# Patient Record
Sex: Female | Born: 1980 | Race: White | Hispanic: No | Marital: Married | Smoking: Former smoker
Health system: Southern US, Community
[De-identification: ages and names within clinical notes are randomized; demographics above are authoritative.]

## PROBLEM LIST (undated history)

## (undated) DIAGNOSIS — R569 Unspecified convulsions: Secondary | ICD-10-CM

## (undated) DIAGNOSIS — G40909 Epilepsy, unspecified, not intractable, without status epilepticus: Secondary | ICD-10-CM

## (undated) DIAGNOSIS — D649 Anemia, unspecified: Secondary | ICD-10-CM

## (undated) DIAGNOSIS — Z1371 Encounter for nonprocreative screening for genetic disease carrier status: Secondary | ICD-10-CM

## (undated) DIAGNOSIS — K219 Gastro-esophageal reflux disease without esophagitis: Secondary | ICD-10-CM

## (undated) DIAGNOSIS — T884XXA Failed or difficult intubation, initial encounter: Secondary | ICD-10-CM

## (undated) DIAGNOSIS — J45909 Unspecified asthma, uncomplicated: Secondary | ICD-10-CM

## (undated) DIAGNOSIS — J189 Pneumonia, unspecified organism: Secondary | ICD-10-CM

## (undated) DIAGNOSIS — Z8041 Family history of malignant neoplasm of ovary: Secondary | ICD-10-CM

## (undated) DIAGNOSIS — E039 Hypothyroidism, unspecified: Secondary | ICD-10-CM

## (undated) DIAGNOSIS — R0600 Dyspnea, unspecified: Secondary | ICD-10-CM

## (undated) DIAGNOSIS — R011 Cardiac murmur, unspecified: Secondary | ICD-10-CM

## (undated) HISTORY — PX: TONSILLECTOMY: SUR1361

## (undated) HISTORY — DX: Encounter for nonprocreative screening for genetic disease carrier status: Z13.71

## (undated) HISTORY — PX: ABDOMINAL HYSTERECTOMY: SHX81

## (undated) HISTORY — DX: Family history of malignant neoplasm of ovary: Z80.41

## (undated) HISTORY — PX: TUBAL LIGATION: SHX77

## (undated) HISTORY — DX: Gastro-esophageal reflux disease without esophagitis: K21.9

---
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
## 2001-04-14 ENCOUNTER — Other Ambulatory Visit: Admission: RE | Admit: 2001-04-14 | Discharge: 2001-04-14 | Payer: Self-pay | Admitting: Obstetrics & Gynecology

## 2001-08-15 ENCOUNTER — Ambulatory Visit (HOSPITAL_COMMUNITY): Admission: RE | Admit: 2001-08-15 | Discharge: 2001-08-15 | Payer: Self-pay | Admitting: Obstetrics and Gynecology

## 2001-08-15 ENCOUNTER — Encounter: Payer: Self-pay | Admitting: Obstetrics and Gynecology

## 2001-09-04 ENCOUNTER — Observation Stay (HOSPITAL_COMMUNITY): Admission: AD | Admit: 2001-09-04 | Discharge: 2001-09-05 | Payer: Self-pay | Admitting: Obstetrics and Gynecology

## 2001-10-12 ENCOUNTER — Encounter: Payer: Self-pay | Admitting: Obstetrics and Gynecology

## 2001-10-12 ENCOUNTER — Ambulatory Visit (HOSPITAL_COMMUNITY): Admission: RE | Admit: 2001-10-12 | Discharge: 2001-10-12 | Payer: Self-pay | Admitting: Obstetrics and Gynecology

## 2001-11-14 ENCOUNTER — Inpatient Hospital Stay (HOSPITAL_COMMUNITY): Admission: AD | Admit: 2001-11-14 | Discharge: 2001-11-17 | Payer: Self-pay | Admitting: *Deleted

## 2001-11-18 ENCOUNTER — Encounter: Admission: RE | Admit: 2001-11-18 | Discharge: 2001-12-18 | Payer: Self-pay | Admitting: Obstetrics and Gynecology

## 2001-12-19 ENCOUNTER — Encounter: Admission: RE | Admit: 2001-12-19 | Discharge: 2002-01-18 | Payer: Self-pay | Admitting: Obstetrics and Gynecology

## 2002-02-18 ENCOUNTER — Encounter: Admission: RE | Admit: 2002-02-18 | Discharge: 2002-03-20 | Payer: Self-pay | Admitting: Obstetrics and Gynecology

## 2002-04-20 ENCOUNTER — Encounter: Admission: RE | Admit: 2002-04-20 | Discharge: 2002-05-20 | Payer: Self-pay | Admitting: Obstetrics and Gynecology

## 2002-05-21 ENCOUNTER — Encounter: Admission: RE | Admit: 2002-05-21 | Discharge: 2002-06-20 | Payer: Self-pay | Admitting: Obstetrics and Gynecology

## 2002-06-21 ENCOUNTER — Encounter: Admission: RE | Admit: 2002-06-21 | Discharge: 2002-07-21 | Payer: Self-pay | Admitting: Obstetrics and Gynecology

## 2002-08-21 ENCOUNTER — Encounter: Admission: RE | Admit: 2002-08-21 | Discharge: 2002-09-20 | Payer: Self-pay | Admitting: Obstetrics and Gynecology

## 2006-06-21 ENCOUNTER — Ambulatory Visit: Payer: Self-pay | Admitting: *Deleted

## 2006-06-24 ENCOUNTER — Ambulatory Visit: Payer: Self-pay | Admitting: *Deleted

## 2008-09-27 ENCOUNTER — Emergency Department: Payer: Self-pay | Admitting: Emergency Medicine

## 2008-11-01 ENCOUNTER — Ambulatory Visit: Payer: Self-pay | Admitting: Family Medicine

## 2009-01-14 ENCOUNTER — Inpatient Hospital Stay: Payer: Self-pay | Admitting: Obstetrics and Gynecology

## 2009-03-25 ENCOUNTER — Observation Stay: Payer: Self-pay

## 2009-03-26 ENCOUNTER — Ambulatory Visit: Payer: Self-pay

## 2009-05-05 ENCOUNTER — Ambulatory Visit: Payer: Self-pay

## 2009-05-06 ENCOUNTER — Inpatient Hospital Stay: Payer: Self-pay

## 2011-12-24 ENCOUNTER — Emergency Department: Payer: Self-pay | Admitting: Emergency Medicine

## 2011-12-24 LAB — BASIC METABOLIC PANEL
Anion Gap: 6 — ABNORMAL LOW (ref 7–16)
Calcium, Total: 8.8 mg/dL (ref 8.5–10.1)
Co2: 27 mmol/L (ref 21–32)
EGFR (African American): >60
Glucose: 76 mg/dL (ref 65–99)
Osmolality: 279 (ref 275–301)

## 2011-12-24 LAB — HEPATIC FUNCTION PANEL A (ARMC)
Albumin: 3.3 g/dL — ABNORMAL LOW (ref 3.4–5.0)
Bilirubin, Direct: 0.1 mg/dL (ref 0.00–0.20)
Bilirubin,Total: 0.4 mg/dL (ref 0.2–1.0)
SGOT(AST): 24 U/L (ref 15–37)
Total Protein: 7.7 g/dL (ref 6.4–8.2)

## 2011-12-24 LAB — CBC
HGB: 11.5 g/dL — ABNORMAL LOW (ref 12.0–16.0)
MCH: 25.2 pg — ABNORMAL LOW (ref 26.0–34.0)
MCHC: 32.5 g/dL (ref 32.0–36.0)
Platelet: 321 10*3/uL (ref 150–440)
RBC: 4.55 10*6/uL (ref 3.80–5.20)
RDW: 15.8 % — ABNORMAL HIGH (ref 11.5–14.5)

## 2011-12-24 LAB — DIFFERENTIAL
Basophil %: 0.9 %
Eosinophil #: 0.2 10*3/uL (ref 0.0–0.7)
Eosinophil %: 2.2 %
Lymphocyte %: 26.1 %
Monocyte %: 6 %
Neutrophil #: 4.3 10*3/uL (ref 1.4–6.5)
Neutrophil %: 64.8 %

## 2011-12-24 LAB — TROPONIN I: Troponin-I: <0.02 ng/mL

## 2013-07-09 ENCOUNTER — Emergency Department (HOSPITAL_COMMUNITY): Payer: Self-pay

## 2013-07-09 ENCOUNTER — Emergency Department (HOSPITAL_COMMUNITY)
Admission: EM | Admit: 2013-07-09 | Discharge: 2013-07-09 | Disposition: A | Discharge status: Home / Self Care | Payer: Self-pay | Attending: Emergency Medicine | Admitting: Emergency Medicine

## 2013-07-09 ENCOUNTER — Encounter (HOSPITAL_COMMUNITY): Payer: Self-pay | Admitting: Emergency Medicine

## 2013-07-09 DIAGNOSIS — R638 Other symptoms and signs concerning food and fluid intake: Secondary | ICD-10-CM | POA: Insufficient documentation

## 2013-07-09 DIAGNOSIS — G479 Sleep disorder, unspecified: Secondary | ICD-10-CM | POA: Insufficient documentation

## 2013-07-09 DIAGNOSIS — R059 Cough, unspecified: Secondary | ICD-10-CM | POA: Insufficient documentation

## 2013-07-09 DIAGNOSIS — R071 Chest pain on breathing: Secondary | ICD-10-CM | POA: Insufficient documentation

## 2013-07-09 DIAGNOSIS — Z8669 Personal history of other diseases of the nervous system and sense organs: Secondary | ICD-10-CM | POA: Insufficient documentation

## 2013-07-09 DIAGNOSIS — R0789 Other chest pain: Secondary | ICD-10-CM

## 2013-07-09 DIAGNOSIS — R05 Cough: Secondary | ICD-10-CM | POA: Insufficient documentation

## 2013-07-09 DIAGNOSIS — R0602 Shortness of breath: Secondary | ICD-10-CM | POA: Insufficient documentation

## 2013-07-09 HISTORY — DX: Unspecified convulsions: R56.9

## 2013-07-09 HISTORY — DX: Epilepsy, unspecified, not intractable, without status epilepticus: G40.909

## 2013-07-09 LAB — BASIC METABOLIC PANEL
BUN: 8 mg/dL (ref 6–23)
CO2: 25 mEq/L (ref 19–32)
Calcium: 9.3 mg/dL (ref 8.4–10.5)
Chloride: 103 mEq/L (ref 96–112)
Creatinine, Ser: 0.83 mg/dL (ref 0.50–1.10)
GFR calc Af Amer: >90 mL/min (ref >90)
GFR calc non Af Amer: >90 mL/min (ref >90)
Glucose, Bld: 95 mg/dL (ref 70–99)
Potassium: 3.6 mEq/L — ABNORMAL LOW (ref 3.7–5.3)
Sodium: 141 mEq/L (ref 137–147)

## 2013-07-09 LAB — CBC WITH DIFFERENTIAL/PLATELET
Basophils Absolute: 0 10*3/uL (ref 0.0–0.1)
Basophils Relative: 0 % (ref 0–1)
Eosinophils Absolute: 0.1 10*3/uL (ref 0.0–0.7)
Eosinophils Relative: 1 % (ref 0–5)
HCT: 38.2 % (ref 36.0–46.0)
Hemoglobin: 12.2 g/dL (ref 12.0–15.0)
Lymphocytes Relative: 20 % (ref 12–46)
Lymphs Abs: 1.6 10*3/uL (ref 0.7–4.0)
MCH: 26.1 pg (ref 26.0–34.0)
MCHC: 31.9 g/dL (ref 30.0–36.0)
MCV: 81.6 fL (ref 78.0–100.0)
Monocytes Absolute: 0.5 10*3/uL (ref 0.1–1.0)
Monocytes Relative: 6 % (ref 3–12)
Neutro Abs: 6 10*3/uL (ref 1.7–7.7)
Neutrophils Relative %: 73 % (ref 43–77)
Platelets: 380 10*3/uL (ref 150–400)
RBC: 4.68 MIL/uL (ref 3.87–5.11)
RDW: 14.4 % (ref 11.5–15.5)
WBC: 8.3 10*3/uL (ref 4.0–10.5)

## 2013-07-09 LAB — TROPONIN I: Troponin I: <0.3 ng/mL (ref <0.30)

## 2013-07-09 LAB — D-DIMER, QUANTITATIVE: D-Dimer, Quant: 0.52 ug/mL-FEU — ABNORMAL HIGH (ref 0.00–0.48)

## 2013-07-09 MED ORDER — FAMOTIDINE 40 MG PO TABS: 40.0000 mg | ORAL_TABLET | Freq: Two times a day (BID) | ORAL | Status: DC

## 2013-07-09 MED ORDER — ACETAMINOPHEN 500 MG PO TABS
1000.0000 mg | ORAL_TABLET | Freq: Once | ORAL | Status: AC
  Administered 2013-07-09: 1000 mg via ORAL
  Filled 2013-07-09: qty 2

## 2013-07-09 MED ORDER — SODIUM CHLORIDE 0.9 % IV SOLN
INTRAVENOUS | Status: DC
  Administered 2013-07-09: 20:00:00 via INTRAVENOUS

## 2013-07-09 MED ORDER — CYCLOBENZAPRINE HCL 10 MG PO TABS
10.0000 mg | ORAL_TABLET | Freq: Once | ORAL | Status: AC
  Administered 2013-07-09: 10 mg via ORAL
  Filled 2013-07-09: qty 1

## 2013-07-09 MED ORDER — NAPROXEN 375 MG PO TABS: 375.0000 mg | ORAL_TABLET | Freq: Two times a day (BID) | ORAL | Status: DC

## 2013-07-09 MED ORDER — METHOCARBAMOL 500 MG PO TABS: ORAL_TABLET | ORAL | Status: DC

## 2013-07-09 MED ORDER — TRAMADOL HCL 50 MG PO TABS
100.0000 mg | ORAL_TABLET | Freq: Once | ORAL | Status: AC
  Administered 2013-07-09: 100 mg via ORAL
  Filled 2013-07-09: qty 2

## 2013-07-09 MED ORDER — IOHEXOL 350 MG/ML SOLN
100.0000 mL | Freq: Once | INTRAVENOUS | Status: AC | PRN
  Administered 2013-07-09: 100 mL via INTRAVENOUS

## 2013-07-09 MED ORDER — PREDNISONE 20 MG PO TABS: ORAL_TABLET | ORAL | Status: DC

## 2013-07-09 MED ORDER — KETOROLAC TROMETHAMINE 30 MG/ML IJ SOLN
30.0000 mg | Freq: Once | INTRAMUSCULAR | Status: AC
  Administered 2013-07-09: 30 mg via INTRAVENOUS
  Filled 2013-07-09: qty 1

## 2013-07-09 MED ORDER — IBUPROFEN 800 MG PO TABS
800.0000 mg | ORAL_TABLET | Freq: Once | ORAL | Status: AC
  Administered 2013-07-09: 800 mg via ORAL
  Filled 2013-07-09: qty 1

## 2013-07-09 NOTE — ED Notes (Signed)
Chest constant for 2  Weeks. Cough, for 4 days, feels she cannot take a deep breath

## 2013-07-09 NOTE — ED Provider Notes (Signed)
CSN: [id number]     Arrival date & time 07/09/13  1436 History  This chart was scribed for Janice Norrie, MD by Elby Beck, ED Scribe. This patient was seen in room APA01/APA01 and the patient's care was started at 5:38 PM.    Chief Complaint  Patient presents with  . Chest Pain    The history is provided by the patient. No language interpreter was used.    HPI Comments: Colleen Grimes is a 33 y.o. female who presents to the Emergency Department complaining of constant, moderate left-sided chest pain over the past 4-6 weeks (or longer) that worsened over the past few days. She states that the pain is worse at night, and sometimes makes it difficult for her to sleep. She states that her pain occasionally radiates to her back and to the right side of her chest. She states that her pain is worsened with positioning and with deep breathing. She reports that her pain seems to be improved with lying on her left side. She describes her pain as occasionally "sharp". She reports associated SOB, reduced appetite and a mild non-productive cough over the past 5 days. She denies fever, pain or swelling in her lower extremities. . She denies any injury or any recent strenuous activity to have onset her pain. She states that she is active in taking care of 4 kids, and she denies any recent travel. She denies any change in activity that could have caused the pain to start. She denies any prior history of similar chest pain. She states that she does not have a PCP, and that she has not been evaluated for these symptoms until today. She reports that she is not on any medications. She states that she has been off of her seizure medications for 12 years, but that she hasn't had a seizure in 5 years. She denies fever, leg pain/swelling or any other symptoms. She denies smoking.  PCP none  Past Medical History  Diagnosis Date  . Seizures   . Epilepsy    Past Surgical History  Procedure Laterality Date  .  Tonsillectomy    . Cesarean section    . Tubal ligation     History reviewed. No pertinent family history. History  Substance Use Topics  . Smoking status: Never Smoker   . Smokeless tobacco: Not on file  . Alcohol Use: No   Pt babysits as a job  OB History   Grav Para Term Preterm Abortions TAB SAB Ect Mult Living                 Review of Systems  Constitutional: Positive for appetite change (reduced). Negative for fever.  Respiratory: Positive for cough and shortness of breath.   Cardiovascular: Positive for chest pain. Negative for leg swelling.  Psychiatric/Behavioral: Positive for sleep disturbance.  All other systems reviewed and are negative.  Allergies  Dilantin  Home Medications   Prior to Admission medications   Not on File   Triage Vitals: BP 125/79  Pulse 66  Temp(Src) 98.2 F (36.8 C) (Oral)  Resp 18  Ht 5\' 7"  (1.702 m)  Wt 217 lb (98.431 kg)  BMI 33.98 kg/m2  SpO2 100%  LMP 07/06/2013  Vital signs normal    Physical Exam  Nursing note and vitals reviewed. Constitutional: She is oriented to person, place, and time. She appears well-developed and well-nourished.  Non-toxic appearance. She does not appear ill. No distress.  HENT:  Head: Normocephalic and atraumatic.  Right Ear: External ear normal.  Left Ear: External ear normal.  Nose: Nose normal. No mucosal edema or rhinorrhea.  Mouth/Throat: Oropharynx is clear and moist and mucous membranes are normal. No dental abscesses or uvula swelling.  Eyes: Conjunctivae and EOM are normal. Pupils are equal, round, and reactive to light.  Neck: Normal range of motion and full passive range of motion without pain. Neck supple.  Cardiovascular: Normal rate, regular rhythm and normal heart sounds.  Exam reveals no gallop and no friction rub.   No murmur heard. Pulmonary/Chest: Effort normal and breath sounds normal. No respiratory distress. She has no wheezes. She has no rhonchi. She has no rales. She  exhibits tenderness. She exhibits no crepitus.    Mild tenderness to palpation in left chest  Abdominal: Soft. Normal appearance and bowel sounds are normal. She exhibits no distension. There is no tenderness. There is no rebound and no guarding.  Musculoskeletal: Normal range of motion. She exhibits no edema and no tenderness.  Moves all extremities well.   Neurological: She is alert and oriented to person, place, and time. She has normal strength. No cranial nerve deficit.  Skin: Skin is warm, dry and intact. No rash noted. No erythema. No pallor.  Psychiatric: She has a normal mood and affect. Her speech is normal and behavior is normal. Her mood appears not anxious.    ED Course  Procedures (including critical care time)  Medications  0.9 %  sodium chloride infusion ( Intravenous New Bag/Given 07/09/13 1959)  ibuprofen (ADVIL,MOTRIN) tablet 800 mg (800 mg Oral Given 07/09/13 1819)  cyclobenzaprine (FLEXERIL) tablet 10 mg (10 mg Oral Given 07/09/13 1819)  iohexol (OMNIPAQUE) 350 MG/ML injection 100 mL (100 mLs Intravenous Contrast Given 07/09/13 1932)  ketorolac (TORADOL) 30 MG/ML injection 30 mg (30 mg Intravenous Given 07/09/13 2000)     DIAGNOSTIC STUDIES: Oxygen Saturation is 100% on RA, normal by my interpretation.    COORDINATION OF CARE: 5:48 PM- Discussed lab findings and plan to obtain additional lab work .also discussed clinical suspicion that pt's pain is musculoskeletal, along with plan to order Flexeril. Pt advised of plan for treatment and pt agrees.  6:30 PM- Discussed D-dimer findings warranting further evaluation to rule out a blood clot.   8:36 PM- Discussed lab findings which rule out a blood clot. Advised pt that her pain is most probably muscular in nature. Discussed plan to treat with steroids/ muscle relaxants.   Labs Review Results for orders placed during the hospital encounter of 83/38/25  BASIC METABOLIC PANEL      Result Value Ref Range   Sodium 141   137 - 147 mEq/L   Potassium 3.6 (*) 3.7 - 5.3 mEq/L   Chloride 103  96 - 112 mEq/L   CO2 25  19 - 32 mEq/L   Glucose, Bld 95  70 - 99 mg/dL   BUN 8  6 - 23 mg/dL   Creatinine, Ser 0.83  0.50 - 1.10 mg/dL   Calcium 9.3  8.4 - 10.5 mg/dL   GFR calc non Af Amer >90  >90 mL/min   GFR calc Af Amer >90  >90 mL/min  CBC WITH DIFFERENTIAL      Result Value Ref Range   WBC 8.3  4.0 - 10.5 K/uL   RBC 4.68  3.87 - 5.11 MIL/uL   Hemoglobin 12.2  12.0 - 15.0 g/dL   HCT 38.2  36.0 - 46.0 %   MCV 81.6  78.0 - 100.0 fL  MCH 26.1  26.0 - 34.0 pg   MCHC 31.9  30.0 - 36.0 g/dL   RDW 14.4  11.5 - 15.5 %   Platelets 380  150 - 400 K/uL   Neutrophils Relative % 73  43 - 77 %   Neutro Abs 6.0  1.7 - 7.7 K/uL   Lymphocytes Relative 20  12 - 46 %   Lymphs Abs 1.6  0.7 - 4.0 K/uL   Monocytes Relative 6  3 - 12 %   Monocytes Absolute 0.5  0.1 - 1.0 K/uL   Eosinophils Relative 1  0 - 5 %   Eosinophils Absolute 0.1  0.0 - 0.7 K/uL   Basophils Relative 0  0 - 1 %   Basophils Absolute 0.0  0.0 - 0.1 K/uL  TROPONIN I      Result Value Ref Range   Troponin I <0.30  <0.30 ng/mL  D-DIMER, QUANTITATIVE      Result Value Ref Range   D-Dimer, Quant 0.52 (*) 0.00 - 0.48 ug/mL-FEU   Laboratory interpretation all normal except mildly elevated d-dimer   Imaging Review Dg Chest 2 View  07/09/2013   CLINICAL DATA:  Chest pain.  Smoking history.  EXAM: CHEST  2 VIEW  COMPARISON:  None.  FINDINGS: Heart size is normal. Mediastinal shadows are normal. The lungs are clear. The vascularity is normal. No effusions. No bony abnormality.  IMPRESSION: Normal chest   Electronically Signed   By: Nelson Chimes M.D.   On: 07/09/2013 16:01   Ct Angio Chest W/cm &/or Wo Cm  07/09/2013   CLINICAL DATA:  Chest pain  EXAM: CT ANGIOGRAPHY CHEST WITH CONTRAST  TECHNIQUE: Multidetector CT imaging of the chest was performed using the standard protocol during bolus administration of intravenous contrast. Multiplanar CT image  reconstructions and MIPs were obtained to evaluate the vascular anatomy.  CONTRAST:  158mL OMNIPAQUE IOHEXOL 350 MG/ML SOLN  COMPARISON:  None.  FINDINGS: There are no filling defects in the pulmonary arterial tree to suggest acute pulmonary thromboembolism.  No abnormal mediastinal adenopathy.  No pneumothorax or pleural effusion.  Clear lungs.  Review of the MIP images confirms the above findings.  IMPRESSION: No evidence of acute pulmonary thromboembolism.   Electronically Signed   By: Maryclare Bean M.D.   On: 07/09/2013 20:03     EKG Interpretation   Date/Time:  Monday July 09 2013 15:29:51 EDT Ventricular Rate:  68 PR Interval:  126 QRS Duration: 72 QT Interval:  374 QTC Calculation: 397 R Axis:   48 Text Interpretation:  Normal sinus rhythm Normal ECG No previous ECGs  available No previous ECGs available Confirmed by Wyvonnia Dusky  MD, STEPHEN  [phone]) on 07/09/2013 3:37:22 PM      MDM   Final diagnoses:  Atypical chest pain  Chest wall pain    New Prescriptions   FAMOTIDINE (PEPCID) 40 MG TABLET    Take 1 tablet (40 mg total) by mouth 2 (two) times daily.   METHOCARBAMOL (ROBAXIN) 500 MG TABLET    Take 1 or 2 po Q 6hrs for pain   NAPROXEN (NAPROSYN) 375 MG TABLET    Take 1 tablet (375 mg total) by mouth 2 (two) times daily.   PREDNISONE (DELTASONE) 20 MG TABLET    Take 3 po QD x 3d , then 2 po QD x 3d then 1 po QD x 3d    Plan discharge  Rolland Porter, MD, FACEP  I personally performed the services described in this documentation,  which was scribed in my presence. The recorded information has been reviewed and considered.  Rolland Porter, MD, Abram Sander   Janice Norrie, MD 07/09/13 2044

## 2013-07-09 NOTE — Discharge Instructions (Signed)
Try ice/ heat to your chest. Take the medications as prescribed. Recheck if you get a fever, cough or struggle to breathe. Take mucinex DM OTC for cough.

## 2013-07-09 NOTE — ED Notes (Signed)
ER MD in with pt at this time.

## 2013-07-09 NOTE — ED Notes (Signed)
PT c/o chest pain x3 weeks worsening today and a dry cough. PT c/o pain worsening on inhalation. PT not on any prescribed medications. Family present at bedside. PT alert and oriented.

## 2015-01-03 ENCOUNTER — Ambulatory Visit
Admission: EM | Admit: 2015-01-03 | Discharge: 2015-01-03 | Disposition: A | Discharge status: Home / Self Care | Payer: Self-pay | Attending: Internal Medicine | Admitting: Internal Medicine

## 2015-01-03 DIAGNOSIS — M7522 Bicipital tendinitis, left shoulder: Secondary | ICD-10-CM

## 2015-01-03 DIAGNOSIS — M25512 Pain in left shoulder: Secondary | ICD-10-CM

## 2015-01-03 DIAGNOSIS — M6248 Contracture of muscle, other site: Secondary | ICD-10-CM

## 2015-01-03 DIAGNOSIS — M62838 Other muscle spasm: Secondary | ICD-10-CM

## 2015-01-03 MED ORDER — NAPROXEN 500 MG PO TABS: 500.0000 mg | ORAL_TABLET | Freq: Two times a day (BID) | ORAL | Status: DC

## 2015-01-03 MED ORDER — CYCLOBENZAPRINE HCL 5 MG PO TABS: 5.0000 mg | ORAL_TABLET | Freq: Three times a day (TID) | ORAL | Status: DC | PRN

## 2015-01-03 NOTE — ED Provider Notes (Signed)
CSN: [id number]     Arrival date & time 01/03/15  7322 History   First MD Initiated Contact with Patient 01/03/15 [phone]     Chief Complaint  Patient presents with  . Shoulder Pain   (Consider location/radiation/quality/duration/timing/severity/associated sxs/prior Treatment) HPI Comments: Married caucasian female here for evaluation of left shoulder pain.  Started suddenly on Monday 10 Oct unable to sleep last night.  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  Typically lifting 50 lbs.  Tried advil 2 tabs every 8 hours since Monday without relief.  If not moving it or lying on it not as painful but with putting on seatbelt/driving sharp pain.  Has been trying to rest left arm and only using right.  No known injury.  Denied popping, grating, giving out, hand/arm weakness, radiating pain, bruising, swelling.  Patient is a 34 y.o. female presenting with shoulder pain. The history is provided by the patient.  Shoulder Pain Location:  Shoulder Time since incident:  5 days Injury: no   Shoulder location:  L shoulder Pain details:    Quality:  Sharp   Radiates to:  Does not radiate   Severity:  Moderate   Onset quality:  Sudden   Duration:  5 days   Timing:  Intermittent   Progression:  Unchanged Chronicity:  New Handedness:  Right-handed Dislocation: no   Prior injury to area:  No Relieved by:  Rest and immobilization Worsened by:  Movement Ineffective treatments:  NSAIDs, rest, ice and being still Associated symptoms: decreased range of motion   Associated symptoms: no back pain, no fatigue, no fever, no muscle weakness, no neck pain, no numbness, no stiffness, no swelling and no tingling   Risk factors: no concern for non-accidental trauma, no known bone disorder, no frequent fractures and no recent illness      Past Medical History  Diagnosis Date  . Seizures ([place])   . Epilepsy [facility])    Past Surgical History  Procedure Laterality Date  . Tonsillectomy    . Cesarean section    . Tubal ligation     History reviewed. No pertinent family history. Social History  Substance Use Topics  . Smoking status: Never Smoker   . Smokeless tobacco: None  . Alcohol Use: No   OB History    No data available     Review of Systems  Constitutional: Negative for fever, chills, diaphoresis, activity change, appetite change, fatigue and unexpected weight change.  HENT: Negative for congestion, dental problem, drooling, ear discharge, ear pain and facial swelling.   Eyes: Negative for photophobia, pain, discharge, redness, itching and visual disturbance.  Respiratory: Negative for cough, choking, chest tightness, shortness of breath, wheezing and stridor.   Cardiovascular: Negative for chest pain, palpitations and leg swelling.  Gastrointestinal: Negative for nausea, vomiting, abdominal pain, diarrhea, constipation, blood in stool, abdominal distention and rectal pain.  Endocrine: Negative for cold intolerance and heat intolerance.  Genitourinary: Negative for dysuria.  Musculoskeletal: Positive for arthralgias. Negative for myalgias, back pain, joint swelling, gait problem, stiffness, neck pain and neck stiffness.  Skin: Negative for color change, pallor, rash and wound.  Allergic/Immunologic: Negative for environmental allergies and food allergies.  Neurological: Negative for dizziness, tremors, seizures, syncope, facial asymmetry, speech difficulty, weakness, light-headedness, numbness and headaches.  Hematological: Negative for adenopathy. Does not bruise/bleed easily.  Psychiatric/Behavioral: Positive for sleep disturbance. Negative for behavioral problems, confusion and agitation.    Allergies  Dilantin  Home Medications   Prior to Admission medications   Medication Sig Start Date End Date  Taking? Authorizing Provider  ranitidine (ZANTAC) 150 MG capsule Take 150 mg by mouth 2 (two) times daily.   Yes Historical Provider, MD  cyclobenzaprine (FLEXERIL) 5 MG tablet Take 1 tablet (5 mg total) by mouth 3 (three) times daily as needed for muscle spasms. 01/03/15   Olen Cordial, NP  naproxen (NAPROSYN) 500 MG tablet Take 1 tablet (500 mg total) by mouth 2 (two) times daily with a meal. 01/03/15   Olen Cordial, NP   Meds Ordered and Administered this Visit  Medications - No data to display  BP 100/60 mmHg  Pulse 72  Temp(Src) 98 F (36.7 C) (Oral)  Resp 18  Ht 5\' 7"  (1.702 m)  Wt 220 lb (99.791 kg)  BMI 34.45 kg/m2  SpO2 99%  LMP 01/03/2015 (Exact Date) No data found.   Physical Exam  Constitutional: She is oriented to person, place, and time. Vital signs are normal. She appears well-developed and well-nourished. No distress.  HENT:  Head: Normocephalic and atraumatic.  Right Ear: External ear normal.  Left Ear: External ear normal.  Nose: Nose normal.  Mouth/Throat: Oropharynx is clear and moist. No oropharyngeal exudate.  Eyes: Conjunctivae, EOM and lids are normal. Pupils are equal, round, and reactive to light. Right eye exhibits no discharge. Left eye exhibits no discharge. No scleral icterus.  Neck: Trachea normal. Neck supple. Muscular tenderness present. No tracheal tenderness and no spinous process tenderness present. No rigidity. Decreased range of motion present. No tracheal deviation, no edema and no erythema present. No thyroid mass and no thyromegaly present.  Cardiovascular: Normal rate, regular rhythm, S1 normal, S2 normal, normal heart sounds and intact distal pulses.  Exam reveals no gallop and no friction rub.   No murmur heard. Pulses:      Radial pulses are 2+ on the right side, and 2+ on the left side.  Pulmonary/Chest: Effort normal and breath sounds normal. No accessory muscle usage or stridor. No respiratory distress. She has no decreased  breath sounds. She has no wheezes. She has no rhonchi. She has no rales. She exhibits no tenderness.  Abdominal: Soft. She exhibits no distension.  Musculoskeletal: She exhibits tenderness. She exhibits no edema.       Right shoulder: Normal.       Left shoulder: She exhibits decreased range of motion, tenderness, pain and spasm. She exhibits no bony tenderness, no swelling, no effusion, no crepitus, no deformity, no laceration, normal pulse and normal strength.       Right elbow: Normal.      Left elbow: Normal.       Right wrist: Normal.       Left wrist: Normal.       Cervical back: She exhibits decreased range of motion, tenderness, pain and spasm. She exhibits no bony tenderness, no swelling, no edema, no deformity, no laceration and normal pulse.       Thoracic back: Normal.       Lumbar back: Normal.       Right upper arm: Normal.       Left upper arm: She exhibits tenderness. She exhibits no bony tenderness, no swelling, no edema and no deformity.       Right forearm: Normal.       Left forearm: Normal.  Right hand: Normal.       Left hand: Normal.  Pain with left shoulder external rotation, cross body, atchley scratch overhead, abduction greater than 90 degrees.  Negative empty can.  Proximal biceps tendon TTP exquisitely and anterior deltoid TTP mildly  Lymphadenopathy:    She has no cervical adenopathy.  Neurological: She is alert and oriented to person, place, and time. She has normal reflexes. She displays no atrophy, no tremor and normal reflexes. No cranial nerve deficit or sensory deficit. She exhibits normal muscle tone. She displays no seizure activity. Coordination and gait normal. GCS eye subscore is 4. GCS verbal subscore is 5. GCS motor subscore is 6.  Reflex Scores:      Brachioradialis reflexes are 2+ on the right side and 2+ on the left side. Bilateral hand grasp equal 5/5  Skin: Skin is warm, dry and intact. No abrasion, no bruising, no burn, no ecchymosis, no  laceration, no lesion, no petechiae and no rash noted. She is not diaphoretic. No erythema. No pallor.  Psychiatric: She has a normal mood and affect. Her speech is normal and behavior is normal. Judgment and thought content normal. Cognition and memory are normal.  Nursing note and vitals reviewed.   ED Course  Procedures (including critical care time)  Labs Review Labs Reviewed - No data to display  Imaging Review No results found.    MDM   1. Biceps tendonitis on left   2. Shoulder pain, acute, left   3. Trapezius muscle spasm    Naproxen 500mg  po BID x2 weeks.  Preferred sedating muscle relaxer for sleep flexeril 5 to 10mg  po bedtime or 5mg  po TID prn muscle spasms.  Home stretches demonstrated to patient-e.g. Arm circles, walking up wall, chest stretches, neck AROM, chin tucks, knee to chest and rock side to side on back.  Consider physical therapy referral if no improvement with prescribed therapy and home exercises.  Ensure ergonomics correct desk at work avoid repetitive motions if possible/holding phone/laptop in hand use desk/stand.  Patient was instructed to rest, ice, and ROM exercises.  Activity as tolerated avoid lifting greater than 5 lbs left hand.  Avoid alcohol intake while taking flexeril.  Follow up if symptoms persist or worsen.  Exitcare handout on neck and shoulder exercises, biceps tendonitis, shoulder pain, muscle spasms given to patient. Work note given to patient for lifting restrictions.  Consider orthopedics if fails NSAID and PT for steroid injection.  Patient cash pay no insurance. Patient verbalized agreement and understanding of treatment plan.  P2:  Injury Prevention and Fitness.   Olen Cordial, NP 01/03/15 1719

## 2015-01-03 NOTE — Discharge Instructions (Signed)
Biceps Tendon Tendinitis (Distal) With Rehab Tendinitis involves inflammation and pain over the affected tendon. The distal biceps tendon (near the elbow) is vulnerable to tendinitis. Distal biceps tendonitis is usually due to the bony bump near the elbow (bicipital tuberosity) causing increased friction over the tendon. The biceps tendon attaches the biceps muscle to one bone in the elbow and two in the shoulder. It is important for proper function of the elbow and for turning the palm upward (supination). SYMPTOMS   Pain, aching, tenderness, and sometimes warmth or redness over the front of the elbow.  Pain when bending the elbow or turning the palm up, using the wrist, especially if performed against resistance.  Crackling sound (crepitation) when the tendon or elbow is moved or touched. CAUSES  The symptoms of biceps tendonitis are due to inflammation of the tendon. Inflammation may be caused by:  Strain from sudden increase in amount or intensity of activity.  Direct blow or injury to the elbow (uncommon).  Overuse or repetitive elbow bending or wrist rotation, particularly when turning the palm up, or with elbow hyperextension. RISK INCREASES WITH:  Sports that involve contact or overhead arm activity (throwing sports, gymnastics, weightlifting, bodybuilding, rock climbing).  Heavy labor.  Poor strength and flexibility.  Failure to warm up properly before activity.  Injury to other structures of the elbow.  Restraint of the elbow. PREVENTION  Warm up and stretch properly before activity.  Allow time for recovery between activities.  Maintain physical fitness:  Strength, flexibility, and endurance.  Cardiovascular fitness.  Learn and use proper exercise technique. PROGNOSIS  With proper treatment, biceps tendon tendonitis is usually curable within 6 weeks.  RELATED COMPLICATIONS   Longer healing time if not properly treated or if not given enough time to  heal.  Chronically inflamed tendon that causes persistent pain with activity, that may progress to constant pain and potentially rupture of the tendon.  Recurring symptoms, especially if activity is resumed too soon, with overuse or with poor technique. TREATMENT  Treatment first involves ice and medicine to reduce pain and inflammation. Modify activities that cause pain, to reduce the chances of causing the condition to get worse. Strengthening and stretching exercises should be performed to promote proper use of the muscles of the elbow. These exercise may be performed at home or with a therapist. Other treatments may be given such as ultrasound or heat therapy. Surgery is usually not recommended.  MEDICATION  If pain medicine is needed, nonsteroidal anti-inflammatory medicines (aspirin and ibuprofen), or other minor pain relievers (acetaminophen), are often advised.  Do not take pain relieving medication for 7 days before surgery.  Prescription pain relievers may be given if your caregiver thinks they are needed. Use only as directed and only as much as you need. HEAT AND COLD:   Cold treatment (icing) should be applied for 10 to 15 minutes every 2 to 3 hours for inflammation and pain, and immediately after activity that aggravates your symptoms. Use ice packs or an ice massage.  Heat treatment may be used before performing stretching and strengthening activities prescribed by your caregiver, physical therapist, or athletic trainer. Use a heat pack or a warm water soak. SEEK MEDICAL CARE IF:   Symptoms get worse or do not improve in 2 weeks, despite treatment.  New, unexplained symptoms develop. (Drugs used in treatment may produce side effects.) EXERCISES  RANGE OF MOTION (ROM) AND STRETCHING EXERCISES - Biceps Tendon Tendinitis (Distal) These exercises may help you when beginning to  rehabilitate your injury. Your symptoms may go away with or without further involvement from your  physician, physical therapist, or athletic trainer. While completing these exercises, remember:   Restoring tissue flexibility helps normal motion to return to the joints. This allows healthier, less painful movement and activity.  An effective stretch should be held for at least 30 seconds.  A stretch should never be painful. You should only feel a gentle lengthening or release in the stretched tissue. STRETCH - Elbow Flexors   Lie on a firm bed or countertop on your back. Be sure that you are in a comfortable position which will allow you to relax your arm muscles.  Place a folded towel under your right / left upper arm, so that your elbow and shoulder are at the same height. Extend your arm; your elbow should not rest on the bed or towel.  Allow the weight of your hand to straighten your elbow. Keep your arm and chest muscles relaxed. Your caretaker may ask you to increase the intensity of your stretch by adding a small wrist or hand weight.  Hold for __________ seconds. You should feel a stretch on the inside of your elbow. Slowly return to the starting position. Repeat __________ times. Complete this exercise __________ times per day. RANGE OF MOTION - Supination, Active   Stand or sit with your elbows at your side. Bend your right / left elbow to 90 degrees.  Turn your palm upward until you feel a gentle stretch on the inside of your forearm.  Hold this position for __________ seconds. Slowly release and return to the starting position. Repeat __________ times. Complete this stretch __________ times per day.  RANGE OF MOTION - Pronation, Active   Stand or sit with your elbows at your side. Bend your right / left elbow to 90 degrees.  Turn your palm downward until you feel a gentle stretch on the top of your forearm.  Hold this position for __________ seconds. Slowly release and return to the starting position. Repeat __________ times. Complete this stretch __________ times per  day.  STRENGTHENING EXERCISES - Biceps Tendon Tendinitis (Distal) These exercises may help you when beginning to rehabilitate your injury. They may resolve your symptoms with or without further involvement from your physician, physical therapist or athletic trainer. While completing these exercises, remember:   Muscles can gain both the endurance and the strength needed for everyday activities through controlled exercises.  Complete these exercises as instructed by your physician, physical therapist or athletic trainer. Increase the resistance and repetitions only as guided.  You may experience muscle soreness or fatigue, but the pain or discomfort you are trying to eliminate should never get worse during these exercises. If this pain does get worse, stop and make sure you are following the directions exactly. If the pain is still present after adjustments, discontinue the exercise until you can discuss the trouble with your clinician. STRENGTH - Elbow Flexors, Isometric   Stand or sit upright on a firm surface. Place your right / left arm so that your hand is palm-up and at the height of your waist.  Place your opposite hand on top of your forearm. Gently push down as your right / left arm resists. Push as hard as you can with both arms without causing any pain or movement at your right / left elbow. Hold this stationary position for __________ seconds.  Gradually release the tension in both arms. Allow your muscles to relax completely before repeating. Repeat  __________ times. Complete this exercise __________ times per day. STRENGTH - Forearm Supinators   Sit with your right / left forearm supported on a table, keeping your elbow below shoulder height. Rest your hand over the edge, palm down.  Gently grip a hammer or a soup ladle.  Without moving your elbow, slowly turn your palm and hand upward to a "thumbs-up" position.  Hold this position for __________ seconds. Slowly return to the  starting position. Repeat __________ times. Complete this exercise __________ times per day.  STRENGTH - Forearm Pronators   Sit with your right / left forearm supported on a table, keeping your elbow below shoulder height. Rest your hand over the edge, palm up.  Gently grip a hammer or a soup ladle.  Without moving your elbow, slowly turn your palm and hand upward to a "thumbs-up" position.  Hold this position for __________ seconds. Slowly return to the starting position. Repeat __________ times. Complete this exercise __________ times per day.  STRENGTH - Elbow Flexors, Supinated  With good posture, stand or sit on a firm chair without armrests. Allow your right / left arm to rest at your side with your palm facing forward.  Holding a __________ weight, or gripping a rubber exercise band or tubing, bring your hand toward your shoulder.  Allow your muscles to control the resistance as your hand returns to your side. Repeat __________ times. Complete this exercise __________ times per day.  STRENGTH - Elbow Flexors, Neutral  With good posture, stand or sit on a firm chair without armrests. Allow your right / left arm to rest at your side with your thumb facing forward.  Holding a __________ weight, or gripping a rubber exercise band or tubing, bring your hand toward your shoulder.  Allow your muscles to control the resistance as your hand returns to your side. Repeat __________ times. Complete this exercise __________ times per day.    This information is not intended to replace advice given to you by your health care provider. Make sure you discuss any questions you have with your health care provider.   Document Released: 03/08/2005 Document Revised: 03/29/2014 Document Reviewed: 06/20/2008 Elsevier Interactive Patient Education 2016 [employer]  Cryotherapy Cryotherapy is when you put ice on your injury. Ice helps lessen pain and puffiness (swelling) after an injury. Ice  works the best when you start using it in the first 24 to 48 hours after an injury. HOME CARE  Put a dry or damp towel between the ice pack and your skin.  You may press gently on the ice pack.  Leave the ice on for no more than 10 to 20 minutes at a time.  Check your skin after 5 minutes to make sure your skin is okay.  Rest at least 20 minutes between ice pack uses.  Stop using ice when your skin loses feeling (numbness).  Do not use ice on someone who cannot tell you when it hurts. This includes small children and people with memory problems (dementia). GET HELP RIGHT AWAY IF:  You have white spots on your skin.  Your skin turns blue or pale.  Your skin feels waxy or hard.  Your puffiness gets worse. MAKE SURE YOU:   Understand these instructions.  Will watch your condition.  Will get help right away if you are not doing well or get worse.   This information is not intended to replace advice given to you by your health care provider. Make sure you discuss any  questions you have with your health care provider.   Document Released: 08/25/2007 Document Revised: 05/31/2011 Document Reviewed: 10/29/2010 Elsevier Interactive Patient Education 2016 [employer] Cervical Sprain A cervical sprain is an injury in the neck in which the strong, fibrous tissues (ligaments) that connect your neck bones stretch or tear. Cervical sprains can range from mild to severe. Severe cervical sprains can cause the neck vertebrae to be unstable. This can lead to damage of the spinal cord and can result in serious nervous system problems. The amount of time it takes for a cervical sprain to get better depends on the cause and extent of the injury. Most cervical sprains heal in 1 to 3 weeks. CAUSES  Severe cervical sprains may be caused by:   Contact sport injuries (such as from football, rugby, wrestling, hockey, auto racing, gymnastics, diving, martial arts, or boxing).   Motor vehicle  collisions.   Whiplash injuries. This is an injury from a sudden forward and backward whipping movement of the head and neck.  Falls.  Mild cervical sprains may be caused by:   Being in an awkward position, such as while cradling a telephone between your ear and shoulder.   Sitting in a chair that does not offer proper support.   Working at a poorly Landscape [employer] station.   Looking up or down for long periods of time.  SYMPTOMS   Pain, soreness, stiffness, or a burning sensation in the front, back, or sides of the neck. This discomfort may develop immediately after the injury or slowly, 24 hours or more after the injury.   Pain or tenderness directly in the middle of the back of the neck.   Shoulder or upper back pain.   Limited ability to move the neck.   Headache.   Dizziness.   Weakness, numbness, or tingling in the hands or arms.   Muscle spasms.   Difficulty swallowing or chewing.   Tenderness and swelling of the neck.  DIAGNOSIS  Most of the time your health care provider can diagnose a cervical sprain by taking your history and doing a physical exam. Your health care provider will ask about previous neck injuries and any known neck problems, such as arthritis in the neck. X-rays may be taken to find out if there are any other problems, such as with the bones of the neck. Other tests, such as a CT scan or MRI, may also be needed.  TREATMENT  Treatment depends on the severity of the cervical sprain. Mild sprains can be treated with rest, keeping the neck in place (immobilization), and pain medicines. Severe cervical sprains are immediately immobilized. Further treatment is done to help with pain, muscle spasms, and other symptoms and may include:  Medicines, such as pain relievers, numbing medicines, or muscle relaxants.   Physical therapy. This may involve stretching exercises, strengthening exercises, and posture training. Exercises and improved  posture can help stabilize the neck, strengthen muscles, and help stop symptoms from returning.  HOME CARE INSTRUCTIONS   Put ice on the injured area.   Put ice in a plastic bag.   Place a towel between your skin and the bag.   Leave the ice on for 15-20 minutes, 3-4 times a day.   If your injury was severe, you may have been given a cervical collar to wear. A cervical collar is a two-piece collar designed to keep your neck from moving while it heals.  Do not remove the collar unless instructed by your health care  provider.  If you have long hair, keep it outside of the collar.  Ask your health care provider before making any adjustments to your collar. Minor adjustments may be required over time to improve comfort and reduce pressure on your chin or on the back of your head.  Ifyou are allowed to remove the collar for cleaning or bathing, follow your health care provider's instructions on how to do so safely.  Keep your collar clean by wiping it with mild soap and water and drying it completely. If the collar you have been given includes removable pads, remove them every 1-2 days and hand wash them with soap and water. Allow them to air dry. They should be completely dry before you wear them in the collar.  If you are allowed to remove the collar for cleaning and bathing, wash and dry the skin of your neck. Check your skin for irritation or sores. If you see any, tell your health care provider.  Do not drive while wearing the collar.   Only take over-the-counter or prescription medicines for pain, discomfort, or fever as directed by your health care provider.   Keep all follow-up appointments as directed by your health care provider.   Keep all physical therapy appointments as directed by your health care provider.   Make any needed adjustments to your workstation to promote good posture.   Avoid positions and activities that make your symptoms worse.   Warm up and  stretch before being active to help prevent problems.  SEEK MEDICAL CARE IF:   Your pain is not controlled with medicine.   You are unable to decrease your pain medicine over time as planned.   Your activity level is not improving as expected.  SEEK IMMEDIATE MEDICAL CARE IF:   You develop any bleeding.  You develop stomach upset.  You have signs of an allergic reaction to your medicine.   Your symptoms get worse.   You develop new, unexplained symptoms.   You have numbness, tingling, weakness, or paralysis in any part of your body.  MAKE SURE YOU:   Understand these instructions.  Will watch your condition.  Will get help right away if you are not doing well or get worse.   This information is not intended to replace advice given to you by your health care provider. Make sure you discuss any questions you have with your health care provider.   Document Released: 01/03/2007 Document Revised: 03/13/2013 Document Reviewed: 09/13/2012 Elsevier Interactive Patient Education 2016 Else[employer] Inc[employer] Muscle Cramps and Spasms Muscle cramps and spasms occur when a muscle or muscles tighten and you have no control over this tightening (involuntary muscle contraction). They are a common problem and can develop in any muscle. The most common place is in the calf muscles of the leg. Both muscle cramps and muscle spasms are involuntary muscle contractions, but they also have differences:   Muscle cramps are sporadic and painful. They may last a few seconds to a quarter of an hour. Muscle cramps are often more forceful and last longer than muscle spasms.  Muscle spasms may or may not be painful. They may also last just a few seconds or much longer. CAUSES  It is uncommon for cramps or spasms to be due to a serious underlying problem. In many cases, the cause of cramps or spasms is unknown. Some common causes are:   Overexertion.   Overuse from repetitive motions (doing the same  thing over and over).   Remaining  in a certain position for a long period of time.   Improper preparation, form, or technique while performing a sport or activity.   Dehydration.   Injury.   Side effects of some medicines.   Abnormally low levels of the salts and ions in your blood (electrolytes), especially potassium and calcium. This could happen if you are taking water pills (diuretics) or you are pregnant.  Some underlying medical problems can make it more likely to develop cramps or spasms. These include, but are not limited to:   Diabetes.   Parkinson disease.   Hormone disorders, such as thyroid problems.   Alcohol abuse.   Diseases specific to muscles, joints, and bones.   Blood vessel disease where not enough blood is getting to the muscles.  HOME CARE INSTRUCTIONS   Stay well hydrated. Drink enough water and fluids to keep your urine clear or pale yellow.  It may be helpful to massage, stretch, and relax the affected muscle.  For tight or tense muscles, use a warm towel, heating pad, or hot shower water directed to the affected area.  If you are sore or have pain after a cramp or spasm, applying ice to the affected area may relieve discomfort.  Put ice in a plastic bag.  Place a towel between your skin and the bag.  Leave the ice on for 15-20 minutes, 03-04 times a day.  Medicines used to treat a known cause of cramps or spasms may help reduce their frequency or severity. Only take over-the-counter or prescription medicines as directed by your caregiver. SEEK MEDICAL CARE IF:  Your cramps or spasms get more severe, more frequent, or do not improve over time.  MAKE SURE YOU:   Understand these instructions.  Will watch your condition.  Will get help right away if you are not doing well or get worse.   This information is not intended to replace advice given to you by your health care provider. Make sure you discuss any questions you have with  your health care provider.   Document Released: 08/28/2001 Document Revised: 07/03/2012 Document Reviewed: 02/23/2012 Elsevier Interactive Patient Education 2016 [place] therapy can help ease sore, stiff, injured, and tight muscles and joints. Heat relaxes your muscles, which may help ease your pain.  RISKS AND COMPLICATIONS If you have any of the following conditions, do not use heat therapy unless your health care provider has approved:  Poor circulation.  Healing wounds or scarred skin in the area being treated.  Diabetes, heart disease, or high blood pressure.  Not being able to feel (numbness) the area being treated.  Unusual swelling of the area being treated.  Active infections.  Blood clots.  Cancer.  Inability to communicate pain. This may include young children and people who have problems with their brain function (dementia).  Pregnancy. Heat therapy should only be used on old, pre-existing, or long-lasting (chronic) injuries. Do not use heat therapy on new injuries unless directed by your health care provider. HOW TO USE HEAT THERAPY There are several different kinds of heat therapy, including:  Moist heat pack.  Warm water bath.  Hot water bottle.  Electric heating pad.  Heated gel pack.  Heated wrap.  Electric heating pad. Use the heat therapy method suggested by your health care provider. Follow your health care provider's instructions on when and how to use heat therapy. GENERAL HEAT THERAPY RECOMMENDATIONS  Do not sleep while using heat therapy. Only use heat therapy while you  are awake.  Your skin may turn pink while using heat therapy. Do not use heat therapy if your skin turns red.  Do not use heat therapy if you have new pain.  High heat or long exposure to heat can cause burns. Be careful when using heat therapy to avoid burning your skin.  Do not use heat therapy on areas of your skin that are already irritated,  such as with a rash or sunburn. SEEK MEDICAL CARE IF:  You have blisters, redness, swelling, or numbness.  You have new pain.  Your pain is worse. MAKE SURE YOU:  Understand these instructions.  Will watch your condition.  Will get help right away if you are not doing well or get worse.   This information is not intended to replace advice given to you by your health care provider. Make sure you discuss any questions you have with your health care provider.   Document Released: 05/31/2011 Document Revised: 03/29/2014 Document Reviewed: 05/01/2013 Elsevier Interactive Patient Education [employer].

## 2015-01-03 NOTE — ED Notes (Signed)
Pt states "I have had left shoulder pain for a week, it was mild pain at first and now I am in severe pain. I was unable to turn in bed last night because of pain.'

## 2015-01-21 ENCOUNTER — Encounter: Payer: Self-pay | Admitting: Emergency Medicine

## 2015-01-21 ENCOUNTER — Emergency Department
Admission: EM | Admit: 2015-01-21 | Discharge: 2015-01-21 | Disposition: A | Discharge status: Home / Self Care | Payer: Self-pay | Attending: Emergency Medicine | Admitting: Emergency Medicine

## 2015-01-21 DIAGNOSIS — Z720 Tobacco use: Secondary | ICD-10-CM | POA: Insufficient documentation

## 2015-01-21 DIAGNOSIS — N751 Abscess of Bartholin's gland: Secondary | ICD-10-CM | POA: Insufficient documentation

## 2015-01-21 DIAGNOSIS — N758 Other diseases of Bartholin's gland: Secondary | ICD-10-CM

## 2015-01-21 DIAGNOSIS — Z791 Long term (current) use of non-steroidal anti-inflammatories (NSAID): Secondary | ICD-10-CM | POA: Insufficient documentation

## 2015-01-21 MED ORDER — CLINDAMYCIN PHOSPHATE 600 MG/50ML IV SOLN
600.0000 mg | Freq: Once | INTRAVENOUS | Status: AC
  Administered 2015-01-21: 600 mg via INTRAVENOUS
  Filled 2015-01-21: qty 50

## 2015-01-21 MED ORDER — OXYCODONE-ACETAMINOPHEN 7.5-325 MG PO TABS: 1.0000 | ORAL_TABLET | Freq: Four times a day (QID) | ORAL | Status: DC | PRN

## 2015-01-21 MED ORDER — CLINDAMYCIN HCL 150 MG PO CAPS: 150.0000 mg | ORAL_CAPSULE | Freq: Four times a day (QID) | ORAL | Status: DC

## 2015-01-21 MED ORDER — HYDROMORPHONE HCL 1 MG/ML IJ SOLN
1.0000 mg | Freq: Once | INTRAMUSCULAR | Status: AC
  Administered 2015-01-21: 1 mg via INTRAMUSCULAR
  Filled 2015-01-21: qty 1

## 2015-01-21 MED ORDER — LIDOCAINE 5 % EX OINT: 1.0000 "application " | TOPICAL_OINTMENT | CUTANEOUS | Status: DC | PRN

## 2015-01-21 NOTE — ED Notes (Signed)
C/o abscess to vagina x 3 days

## 2015-01-21 NOTE — Discharge Instructions (Signed)
Bartholin Cyst or Abscess A Bartholin cyst is a fluid-filled sac that forms on a Bartholin gland. Bartholin glands are small glands that are found in the folds of skin (labia) on the sides of the lower opening of the vagina. This type of cyst causes a bulge on the side of the vagina. A cyst that is not large or infected may not cause problems. However, if the fluid in the cyst becomes infected, the cyst can turn into an abscess. An abscess may cause discomfort or pain. HOME CARE  Take medicines only as told by your doctor.  If you were prescribed an antibiotic medicine, finish all of it even if you start to feel better.  Apply warm, wet compresses to the area or take warm, shallow baths that cover your pelvic area (sitz baths). Do this many times each day or as told by your doctor.  Do not squeeze the cyst. Do not apply heavy pressure to it.  Do not have sex until the cyst has gone away.  If your cyst or abscess was opened by your doctor, a small piece of gauze or a drain may have been placed in the area. That lets the cyst drain. Do not remove the gauze or the drain until your doctor tells you it is okay to do that.  Do not wear tampons. Wear feminine pads as needed for any fluid or blood.  Keep all follow-up visits as told by your doctor. This is important. GET HELP IF:  Your pain, puffiness (swelling), or redness in the area of the cyst gets worse.  You have fluid or pus pus coming from the cyst.  You have a fever.   This information is not intended to replace advice given to you by your health care provider. Make sure you discuss any questions you have with your health care provider.   Document Released: 06/04/2008 Document Revised: 07/23/2014 Document Reviewed: 10/22/2013 Elsevier Interactive Patient Education [employer].

## 2015-01-21 NOTE — ED Notes (Signed)
States has abcess on labia

## 2015-01-21 NOTE — ED Provider Notes (Signed)
[facility] Emergency Department Provider Note  ____________________________________________  Time seen: Approximately 10:04 AM  I have reviewed the triage vital signs and the nursing notes.   HISTORY  Chief Complaint Abscess    HPI Colleen Grimes is a 34 y.o. female female with family abscess protruding from the labia. Patient state onset was 3 days ago. Patient has increased in size and pain. She has been placed on antibiotics cream with no relief. There is no discharge from the lesion. Patient is rating her pain discomfort as a 10 over 10. Patient has a history of MRSA abscesses.   Past Medical History  Diagnosis Date  . Seizures ([place])   . Epilepsy ([place])     There are no active problems to display for this patient.   Past Surgical History  Procedure Laterality Date  . Tonsillectomy    . Cesarean section    . Tubal ligation      Current Outpatient Rx  Name  Route  Sig  Dispense  Refill  . cyclobenzaprine (FLEXERIL) 5 MG tablet   Oral   Take 1 tablet (5 mg total) by mouth 3 (three) times daily as needed for muscle spasms.   30 tablet   0   . naproxen (NAPROSYN) 500 MG tablet   Oral   Take 1 tablet (500 mg total) by mouth 2 (two) times daily with a meal.   30 tablet   0   . ranitidine (ZANTAC) 150 MG capsule   Oral   Take 150 mg by mouth 2 (two) times daily.           Allergies Dilantin  No family history on file.  Social History Social History  Substance Use Topics  . Smoking status: Never Smoker   . Smokeless tobacco: None  . Alcohol Use: No    Review of Systems Constitutional: No fever/chills Eyes: No visual changes. ENT: No sore throat. Cardiovascular: Denies chest pain. Respiratory: Denies shortness of breath. Gastrointestinal: No abdominal pain.  No nausea, no vomiting.  No diarrhea.  No constipation. Genitourinary: Negative for dysuria. Internal labia abscess Musculoskeletal: Negative for back pain. Skin:  Negative for rash. Neurological: Negative for headaches, focal weakness or numbness. Allergic/Immunilogical: Dilantin 10-point ROS otherwise negative.  ____________________________________________   PHYSICAL EXAM:  VITAL SIGNS: ED Triage Vitals  Enc Vitals Group     BP 01/21/15 0917 137/82 mmHg     Pulse Rate 01/21/15 0917 97     Resp 01/21/15 0917 18     Temp 01/21/15 0917 97.9 F (36.6 C)     Temp Source 01/21/15 0917 Oral     SpO2 01/21/15 0917 97 %     Weight 01/21/15 0917 220 lb (99.791 kg)     Height 01/21/15 0917 5\' 7"  (1.702 m)     Head Cir --      Peak Flow --      Pain Score 01/21/15 0917 10     Pain Loc --      Pain Edu? --      Excl. in [place]? --      Constitutional: Alert and oriented. Moderate distress  Eyes: Conjunctivae are normal. PERRL. EOMI. Head: Atraumatic. Nose: No congestion/rhinnorhea. Mouth/Throat: Mucous membranes are moist.  Oropharynx non-erythematous. Neck: No stridor. No cervical spine tenderness to palpation. Hematological/Lymphatic/Immunilogical: No cervical lymphadenopathy. Cardiovascular: Normal rate, regular rhythm. Grossly normal heart sounds.  Good peripheral circulation. Respiratory: Normal respiratory effort.  No retractions. Lungs CTAB. Gastrointestinal: Soft and nontender. No distention.  No abdominal bruits. No CVA tenderness. Genitourinary: Edematous and erythematous nodule lesion protruding from the labia majora. **}Musculoskeletal: No lower extremity tenderness nor edema.  No joint effusions. Neurologic:  Normal speech and language. No gross focal neurologic deficits are appreciated. No gait instability. Skin:  Skin is warm, dry and intact. No rash noted. Psychiatric: Mood and affect are normal. Speech and behavior are normal.  ____________________________________________   LABS (all labs ordered are listed, but only abnormal results are displayed)  Labs Reviewed - No data to  display ____________________________________________  EKG   ____________________________________________  RADIOLOGY   ____________________________________________   PROCEDURES  Procedure(s) performed: None  Critical Care performed: No  ____________________________________________   INITIAL IMPRESSION / ASSESSMENT AND PLAN / ED COURSE  Pertinent labs & imaging results that were available during my care of the patient were reviewed by me and considered in my medical decision making (see chart for details) Infected bartholin glan cyst. Patient did not wish to have an ID the emergency room for this condition. Discussed with GYN on call and was told to advised patient to call his office to set up an appointment. Patient was started on clindamycin and Percocets. ____________________________________________   FINAL CLINICAL IMPRESSION(S) / ED DIAGNOSES  Final diagnoses:  Bartholin's gland infection       Sable Feil, PA-C 01/21/15 [place], MD 01/21/15 2202

## 2015-01-22 ENCOUNTER — Ambulatory Visit: Payer: Self-pay | Admitting: Obstetrics and Gynecology

## 2015-01-22 ENCOUNTER — Ambulatory Visit (INDEPENDENT_AMBULATORY_CARE_PROVIDER_SITE_OTHER): Payer: Self-pay | Admitting: Obstetrics and Gynecology

## 2015-01-22 ENCOUNTER — Encounter: Payer: Self-pay | Admitting: Obstetrics and Gynecology

## 2015-01-22 VITALS — BP 130/76 | HR 90 | Ht 67.0 in | Wt 232.0 lb

## 2015-01-22 DIAGNOSIS — N764 Abscess of vulva: Secondary | ICD-10-CM | POA: Insufficient documentation

## 2015-01-22 MED ORDER — SULFAMETHOXAZOLE-TRIMETHOPRIM 800-160 MG PO TABS: 1.0000 | ORAL_TABLET | Freq: Two times a day (BID) | ORAL | Status: DC

## 2015-01-22 NOTE — Patient Instructions (Addendum)
1. Stop the Clindamycin and start taking the Septra DS antibiotic twice a day for 14 days to treat the infection. A bacterial culture was taken today. 2. Sitz baths twice a day, warm compresses as needed 3. Tylenol, Motrin, Percocet as needed for pain relief 4. Return in 2 weeks for follow up

## 2015-01-22 NOTE — Progress Notes (Signed)
. GYN ENCOUNTER NOTE  Subjective:       Colleen Grimes is a 34 y.o. G51P1003 female is here for gynecologic evaluation of the following issues:   1. Follow up from ED for vulvar abscess. She first noticed an ingrown hair last week which progressed to a painful large mass. She went to the ED on 11/1 and was started on Clindamycin and told to follow up with gynecology for drainage and possible drain placement. Patient reports that the abscess spontaneously ruptured last night, and pain has improved but is still having pain. She has continued taking Clindamycin but reported frequent vomiting since starting it. She has continued using warm compresses. She now reports a subjective fever, has not taken temperature. No history of abscesses, frequently gets ingrown hairs.   Gynecologic History Patient's last menstrual period was 01/03/2015 (exact date).   Obstetric History OB History  Gravida Para Term Preterm AB SAB TAB Ectopic Multiple Living  3 1 1       3     # Outcome Date GA Lbr Len/2nd Weight Sex Delivery Anes PTL Lv  3 Gravida 2011   6 lb 1.6 oz (2.767 kg) F CS-LTranv   Y  2 Gravida 2003   5 lb 2.1 oz (2.327 kg) M CS-LTranv   Y  1 Term 1999   6 lb 12.8 oz (3.084 kg) M CS-LTranv   Y      Past Medical History  Diagnosis Date  . Seizures ([place])   . Epilepsy ([place])   . Acid reflux     Past Surgical History  Procedure Laterality Date  . Tonsillectomy    . Cesarean section    . Tubal ligation      Current Outpatient Prescriptions on File Prior to Visit  Medication Sig Dispense Refill  . clindamycin (CLEOCIN) 150 MG capsule Take 1 capsule (150 mg total) by mouth 4 (four) times daily. 40 capsule 0  . naproxen (NAPROSYN) 500 MG tablet Take 1 tablet (500 mg total) by mouth 2 (two) times daily with a meal. 30 tablet 0  . oxyCODONE-acetaminophen (PERCOCET) 7.5-325 MG tablet Take 1 tablet by mouth every 6 (six) hours as needed for severe pain. 12 tablet 0  . ranitidine (ZANTAC) 150 MG  capsule Take 150 mg by mouth 2 (two) times daily.     No current facility-administered medications on file prior to visit.    Allergies  Allergen Reactions  . Dilantin [Phenytoin Sodium Extended]     Social History   Social History  . Marital Status: Married    Spouse Name: N/A  . Number of Children: N/A  . Years of Education: N/A   Occupational History  . Not on file.   Social History Main Topics  . Smoking status: Never Smoker   . Smokeless tobacco: Not on file  . Alcohol Use: No  . Drug Use: No  . Sexual Activity: Yes    Birth Control/ Protection: Surgical   Other Topics Concern  . Not on file   Social History Narrative    Family History  Problem Relation Age of Onset  . Ovarian cancer Mother   . Vaginal cancer Mother   . Breast cancer Paternal Aunt   . Diabetes Paternal Grandmother   . Diabetes Paternal Grandfather   . Colon cancer Neg Hx   . Heart disease Neg Hx     The following portions of the patient's history were reviewed and updated as appropriate: allergies, current medications, past family history,  past medical history, past social history, past surgical history and problem list.  Review of Systems Review of Systems - General ROS: positive for chills, fever Gastrointestinal ROS: negative for abdominal pain, diarrhea. Positive for nausea, vomiting. Genito-Urinary ROS: negative for - change in menstrual cycle, dysmenorrhea, dyspareunia, dysuria, hematuria, incontinence, irregular/heavy menses, nocturia or pelvic pain. Positive for genital abscess and drainage and pain  Objective:   BP 130/76 mmHg  Pulse 90  Ht 5\' 7"  (1.702 m)  Wt 232 lb (105.235 kg)  BMI 36.33 kg/m2  LMP 01/03/2015 (Exact Date) CONSTITUTIONAL: Well-developed, well-nourished female in no acute distress.  [place]: Alert and oriented to person, place, and time.  PSYCHIATRIC: Normal mood and affect. CARDIOVASCULAR: RRR, no murmurs RESPIRATORY: CTABL ABDOMEN: Soft, non  distended; Non tender.   PELVIC:  External Genitalia: Draining abscess on right clitoral hood/right labia major. No fluctuance.  BUS: No bartholin gland enlargement  Vagina: Normal  Cervix: Not examined  Uterus: Not examined  Adnexa: Not examined  RV: External exam normal   Assessment:   1. Right clitoral hood abscess, spontaneously ruptured, without surrounding cellulitis   Plan:   1. Discontinue clindamycin; prescribe Septra DS BID x 14 days 2. Bacterial culture sent 3. Sitz baths BID, warm compresses 4. Tylenol, Motrin, Percocet as needed for pain 5. Follow up in 2 weeks for recheck   Hubert Azure, PA-S Brayton Mars, MD   I have seen, interviewed, and examined the patient in conjunction with the [facility].A. student and affirm the diagnosis and management plan. Dawnyel Leven A. Romilda Proby, MD, Cherlynn June

## 2015-01-26 LAB — ANAEROBIC AND AEROBIC CULTURE

## 2015-02-05 ENCOUNTER — Ambulatory Visit (INDEPENDENT_AMBULATORY_CARE_PROVIDER_SITE_OTHER): Payer: Self-pay | Admitting: Obstetrics and Gynecology

## 2015-02-05 ENCOUNTER — Encounter: Payer: Self-pay | Admitting: Obstetrics and Gynecology

## 2015-02-05 VITALS — BP 110/73 | HR 73 | Ht 67.0 in | Wt 233.4 lb

## 2015-02-05 DIAGNOSIS — N764 Abscess of vulva: Secondary | ICD-10-CM

## 2015-02-05 NOTE — Patient Instructions (Signed)
1.  No further treatment needed at this time. 2.  Follow-up as needed if symptoms recur. 3.  The problem was a labia minora abscess (not Bartholin's gland abscess)

## 2015-02-05 NOTE — Progress Notes (Addendum)
Patient ID: Colleen Grimes, female   DOB: 07-26-1980, 34 y.o.   MRN: [id number] 2 week f/u vulvar abscess Completed atb Culture-neg   Chief complaint: 1.  Follow-up on vulvar abscess.  Patient is seen for follow-up on right labia minora abscess, which was initially treated in the emergency room.  Clindamycin was given.  Intolerance to clindamycin prompted switched to Septra DS twice a day for 14 days.  Sitz baths were also performed.  The abscess resolved.  OBJECTIVE: BP 110/73 mmHg  Pulse 73  Ht _0  (1.702 m)  Wt 233 lb 6.4 oz (105.87 kg)  BMI 36.55 kg/m2  LMP 01/26/2015 (Exact Date) Well-appearing white female in no acute distress. Pelvic exam External genitalia-normal; right labia majora abscess is resolved; there is a 3 mm [place] noted on the labia minora without induration or erythema. BUS-normal. Introitus-normal.  ASSESSMENT: 1.  Right labia majora abscess, resolved.  PLAN: 1.  Monitor for recurrence of symptoms. 2.  Return as needed.  A total of 15 minutes were spent face-to-face with the patient during this encounter and over half of that time dealt with counseling and coordination of care.  Brayton Mars, MD  Note: This dictation was prepared with Dragon dictation along with smaller phrase technology. Any transcriptional errors that result from this process are unintentional.  ADDENDUM: Mom died of ovarian cancer at age 70 (being diagnosed at age 45) multiple questions were answered regarding genetic link and risk for ovarian cancer.  Literature on BRCA testing and genetic testing for ovarian cancer risks were given to the patient.She is to contact us if she would like to pursue my risk assessment or BRACA testing or if she would like to consider prophylactic oophorectomy.  Brayton Mars, MD

## 2015-11-17 ENCOUNTER — Encounter: Payer: Self-pay | Admitting: Emergency Medicine

## 2015-11-17 ENCOUNTER — Other Ambulatory Visit: Payer: Self-pay

## 2015-11-17 ENCOUNTER — Ambulatory Visit
Admission: EM | Admit: 2015-11-17 | Discharge: 2015-11-17 | Disposition: A | Discharge status: Home / Self Care | Payer: Medicaid Other | Attending: Family Medicine | Admitting: Family Medicine

## 2015-11-17 DIAGNOSIS — K219 Gastro-esophageal reflux disease without esophagitis: Secondary | ICD-10-CM | POA: Insufficient documentation

## 2015-11-17 DIAGNOSIS — R079 Chest pain, unspecified: Secondary | ICD-10-CM | POA: Diagnosis not present

## 2015-11-17 MED ORDER — GI COCKTAIL ~~LOC~~
30.0000 mL | Freq: Once | ORAL | Status: AC
  Administered 2015-11-17: 30 mL via ORAL

## 2015-11-17 NOTE — ED Triage Notes (Signed)
Patient c/o chest pain that started 3 weeks ago.  Patient c/o SOB that started this morning.  Patient denies N/V.

## 2015-11-17 NOTE — ED Provider Notes (Signed)
[facility]    CSN: [id number] Arrival date & time: 11/17/15  1543  First Provider Contact:  None       History   Chief Complaint Chief Complaint  Patient presents with  . Chest Pain    HPI Colleen Grimes is a 35 y.o. female.   The history is provided by the patient.  Chest Pain  Pain location:  Epigastric Pain quality: aching and burning   Pain radiates to:  Does not radiate Pain severity:  Moderate Onset quality:  Sudden Duration:  2 weeks Timing:  Intermittent Progression:  Waxing and waning Chronicity:  Recurrent Context: not at rest   Relieved by:  Certain positions and antacids (mild relief with antacids) Worsened by:  Certain positions Ineffective treatments:  Antacids Associated symptoms: heartburn   Risk factors: obesity   Risk factors: no coronary artery disease and no smoking     Past Medical History:  Diagnosis Date  . Acid reflux   . Epilepsy ([place])   . Seizures ([place])     There are no active problems to display for this patient.   Past Surgical History:  Procedure Laterality Date  . CESAREAN SECTION    . TONSILLECTOMY    . TUBAL LIGATION      OB History    Gravida Para Term Preterm AB Living   3 1 1     3    SAB TAB Ectopic Multiple Live Births           3       Home Medications    Prior to Admission medications   Medication Sig Start Date End Date Taking? Authorizing Provider  ranitidine (ZANTAC) 150 MG capsule Take 150 mg by mouth 2 (two) times daily.    Historical Provider, MD    Family History Family History  Problem Relation Age of Onset  . Ovarian cancer Mother   . Vaginal cancer Mother   . Breast cancer Paternal Aunt   . Diabetes Paternal Grandmother   . Diabetes Paternal Grandfather   . Colon cancer Neg Hx   . Heart disease Neg Hx     Social History Social History  Substance Use Topics  . Smoking status: Never Smoker  . Smokeless tobacco: Never Used  . Alcohol use No     Allergies   Dilantin  [phenytoin sodium extended]   Review of Systems Review of Systems  Cardiovascular: Positive for chest pain.  Gastrointestinal: Positive for heartburn.     Physical Exam Triage Vital Signs ED Triage Vitals  Enc Vitals Group     BP 11/17/15 1554 122/62     Pulse Rate 11/17/15 1554 73     Resp 11/17/15 1554 16     Temp 11/17/15 1554 97.4 F (36.3 C)     Temp Source 11/17/15 1554 Tympanic     SpO2 11/17/15 1554 100 %     Weight 11/17/15 1556 220 lb (99.8 kg)     Height 11/17/15 1556 5\' 6"  (1.676 m)     Head Circumference --      Peak Flow --      Pain Score 11/17/15 1557 6     Pain Loc --      Pain Edu? --      Excl. in [place]? --    No data found.   Updated Vital Signs BP 122/62 (BP Location: Right Arm)   Pulse 73   Temp 97.4 F (36.3 C) (Tympanic)   Resp 16  Ht 5\' 6"  (1.676 m)   Wt 220 lb (99.8 kg)   LMP 10/27/2015 (Approximate)   SpO2 100%   BMI 35.51 kg/m   Visual Acuity Right Eye Distance:   Left Eye Distance:   Bilateral Distance:    Right Eye Near:   Left Eye Near:    Bilateral Near:     Physical Exam  Constitutional: She appears well-developed.  Cardiovascular: Normal rate, regular rhythm, normal heart sounds and intact distal pulses.   No murmur heard. Pulmonary/Chest: Effort normal and breath sounds normal. No respiratory distress. She has no wheezes. She has no rales.  Abdominal: Soft. Bowel sounds are normal. She exhibits no distension and no mass. There is tenderness (mild, epigastric; no rebound or guarding). There is no rebound and no guarding.  Neurological: She is alert.  Skin: Skin is warm and dry. No rash noted. She is not diaphoretic. No erythema.  Nursing note and vitals reviewed.    UC Treatments / Results  Labs (all labs ordered are listed, but only abnormal results are displayed) Labs Reviewed - No data to display  EKG  EKG Interpretation None       Radiology No results found.  Procedures .EKG Date/Time: 11/17/2015  4:34 PM Performed by: Norval Gable Authorized by: Norval Gable   Previous ECG:    Previous ECG:  Unavailable Interpretation:    Interpretation: normal   Rate:    ECG rate assessment: normal   Rhythm:    Rhythm: sinus rhythm   Ectopy:    Ectopy: none   QRS:    QRS axis:  Normal Conduction:    Conduction: normal   ST segments:    ST segments:  Normal T waves:    T waves: normal     (including critical care time)  Medications Ordered in UC Medications  gi cocktail (Maalox,Lidocaine,Donnatal) (30 mLs Oral Given 11/17/15 1628)     Initial Impression / Assessment and Plan / UC Course  I have reviewed the triage vital signs and the nursing notes.  Pertinent labs & imaging results that were available during my care of the patient were reviewed by me and considered in my medical decision making (see chart for details).  Clinical Course      Final Clinical Impressions(s) / UC Diagnoses   Final diagnoses:  Gastroesophageal reflux disease, esophagitis presence not specified    New Prescriptions New Prescriptions   No medications on file   1.diagnosis reviewed with patient/parent/guardian/family 2. Patient given GI cocktail with improvement of symptoms 3. Recommend supportive treatment with otc omeprazole  4. Follow-up with GI   Norval Gable, MD 11/17/15 [phone]

## 2015-12-09 ENCOUNTER — Other Ambulatory Visit: Payer: Self-pay

## 2015-12-09 ENCOUNTER — Encounter: Payer: Self-pay | Admitting: Gastroenterology

## 2015-12-09 ENCOUNTER — Ambulatory Visit (INDEPENDENT_AMBULATORY_CARE_PROVIDER_SITE_OTHER): Payer: Medicaid Other | Admitting: Gastroenterology

## 2015-12-09 VITALS — BP 119/71 | HR 76 | Temp 98.5°F | Ht 67.0 in | Wt 236.8 lb

## 2015-12-09 DIAGNOSIS — K219 Gastro-esophageal reflux disease without esophagitis: Secondary | ICD-10-CM

## 2015-12-09 DIAGNOSIS — R569 Unspecified convulsions: Secondary | ICD-10-CM | POA: Insufficient documentation

## 2015-12-09 NOTE — Progress Notes (Signed)
Gastroenterology Consultation  Referring Provider:     No ref. provider found Primary Care Physician:  Pcp Not In System Primary Gastroenterologist:  Dr. Allen Norris     Reason for Consultation:     GERD        HPI:   Colleen Grimes is a 35 y.o. y/o female referred for consultation & management of GERD by Dr. Merryl Hacker Not In System.  This patient comes in today after being seen in urgent care for 2 years of heartburn symptoms.  The patient states that she has been having heartburn daily without any relief.  The patient states she has been taking Zantac for the last 2 years without any help from her heartburn.  The patient states she was taking Tums like candy.  The patient was then told to take omeprazole when she went to urgent care and follow-up with gastrologist.  The patient states she hasn't had heartburn in the last 3 weeks and she started the medication.  She denies any dysphagia although she does report some epigastric discomfort that has not gotten better even though she is on the PPI.  Past Medical History:  Diagnosis Date  . Acid reflux   . Epilepsy ([place])   . Seizures ([place])     Past Surgical History:  Procedure Laterality Date  . CESAREAN SECTION    . TONSILLECTOMY    . TUBAL LIGATION      Prior to Admission medications   Medication Sig Start Date End Date Taking? Authorizing Provider  escitalopram (LEXAPRO) 10 MG tablet Take by mouth. 12/04/15 03/03/16 Yes Historical Provider, MD  levothyroxine (SYNTHROID, LEVOTHROID) 25 MCG tablet Take by mouth. 12/08/15 12/07/16 Yes Historical Provider, MD  omeprazole (PRILOSEC) 40 MG capsule Take by mouth.   Yes Historical Provider, MD    Family History  Problem Relation Age of Onset  . Ovarian cancer Mother   . Vaginal cancer Mother   . Breast cancer Paternal Aunt   . Diabetes Paternal Grandmother   . Diabetes Paternal Grandfather   . Colon cancer Neg Hx   . Heart disease Neg Hx      Social History  Substance Use Topics  . Smoking  status: Former Research scientist (life sciences)  . Smokeless tobacco: Never Used  . Alcohol use No    Allergies as of 12/09/2015 - Review Complete 12/09/2015  Allergen Reaction Noted  . Dilantin [phenytoin sodium extended]  07/09/2013    Review of Systems:    All systems reviewed and negative except where noted in HPI.   Physical Exam:  BP 119/71   Pulse 76   Temp 98.5 F (36.9 C) (Oral)   Ht 5\' 7"  (1.702 m)   Wt 236 lb 12.8 oz (107.4 kg)   LMP 10/27/2015 (Approximate)   BMI 37.09 kg/m  Patient's last menstrual period was 10/27/2015 (approximate). Psych:  Alert and cooperative. Normal mood and affect. General:   Alert,  Well-developed, well-nourished, pleasant and cooperative in NAD Head:  Normocephalic and atraumatic. Eyes:  Sclera clear, no icterus.   Conjunctiva pink. Ears:  Normal auditory acuity. Nose:  No deformity, discharge, or lesions. Mouth:  No deformity or lesions,oropharynx pink & moist. Neck:  Supple; no masses or thyromegaly. Lungs:  Respirations even and unlabored.  Clear throughout to auscultation.   No wheezes, crackles, or rhonchi. No acute distress. Heart:  Regular rate and rhythm; no murmurs, clicks, rubs, or gallops. Abdomen:  Normal bowel sounds.  No bruits.  Soft, non-tender and non-distended without masses, hepatosplenomegaly  or hernias noted.  No guarding or rebound tenderness.  Negative Carnett sign.   Rectal:  Deferred.  Msk:  Symmetrical without gross deformities.  Good, equal movement & strength bilaterally. Pulses:  Normal pulses noted. Extremities:  No clubbing or edema.  No cyanosis. Neurologic:  Alert and oriented x3;  grossly normal neurologically. Skin:  Intact without significant lesions or rashes.  No jaundice. Lymph Nodes:  No significant cervical adenopathy. Psych:  Alert and cooperative. Normal mood and affect.  Imaging Studies: No results found.  Assessment and Plan:   Colleen Grimes is a 35 y.o. y/o female who comes in today with a history of 2 years  of reflux symptoms.  The patient also has pinpoint epigastric pain.  The patient is now symptom free on omeprazole 20 mg a day.  The patient has been told to continue this and will be set up for an EGD to rule out any Barrett's esophagus or damage done due to the chronic reflux.  The patient has been explained the plan and agrees with it.   Note: This dictation was prepared with Dragon dictation along with smaller phrase technology. Any transcriptional errors that result from this process are unintentional.

## 2015-12-10 ENCOUNTER — Other Ambulatory Visit: Payer: Self-pay

## 2015-12-15 DIAGNOSIS — E669 Obesity, unspecified: Secondary | ICD-10-CM | POA: Insufficient documentation

## 2015-12-15 DIAGNOSIS — R5382 Chronic fatigue, unspecified: Secondary | ICD-10-CM | POA: Insufficient documentation

## 2015-12-15 DIAGNOSIS — M2550 Pain in unspecified joint: Secondary | ICD-10-CM | POA: Insufficient documentation

## 2015-12-22 ENCOUNTER — Ambulatory Visit: Payer: Self-pay | Admitting: Gastroenterology

## 2015-12-23 DIAGNOSIS — E039 Hypothyroidism, unspecified: Secondary | ICD-10-CM | POA: Insufficient documentation

## 2015-12-23 DIAGNOSIS — F411 Generalized anxiety disorder: Secondary | ICD-10-CM | POA: Insufficient documentation

## 2015-12-31 NOTE — Discharge Instructions (Signed)

## 2016-01-02 ENCOUNTER — Ambulatory Visit: Payer: Medicaid Other | Admitting: Student in an Organized Health Care Education/Training Program

## 2016-01-02 ENCOUNTER — Encounter
Admission: RE | Disposition: A | Discharge status: Home / Self Care | Payer: Self-pay | Source: Ambulatory Visit | Attending: Gastroenterology

## 2016-01-02 ENCOUNTER — Ambulatory Visit
Admission: RE | Admit: 2016-01-02 | Discharge: 2016-01-02 | Disposition: A | Discharge status: Home / Self Care | Payer: Medicaid Other | Source: Ambulatory Visit | Attending: Gastroenterology | Admitting: Gastroenterology

## 2016-01-02 DIAGNOSIS — R12 Heartburn: Secondary | ICD-10-CM | POA: Diagnosis not present

## 2016-01-02 DIAGNOSIS — E039 Hypothyroidism, unspecified: Secondary | ICD-10-CM | POA: Insufficient documentation

## 2016-01-02 DIAGNOSIS — Z791 Long term (current) use of non-steroidal anti-inflammatories (NSAID): Secondary | ICD-10-CM | POA: Insufficient documentation

## 2016-01-02 DIAGNOSIS — K219 Gastro-esophageal reflux disease without esophagitis: Secondary | ICD-10-CM | POA: Insufficient documentation

## 2016-01-02 DIAGNOSIS — Z79899 Other long term (current) drug therapy: Secondary | ICD-10-CM | POA: Diagnosis not present

## 2016-01-02 DIAGNOSIS — Z87891 Personal history of nicotine dependence: Secondary | ICD-10-CM | POA: Insufficient documentation

## 2016-01-02 HISTORY — DX: Anemia, unspecified: D64.9

## 2016-01-02 HISTORY — PX: ESOPHAGOGASTRODUODENOSCOPY (EGD) WITH PROPOFOL: SHX5813

## 2016-01-02 HISTORY — DX: Dyspnea, unspecified: R06.00

## 2016-01-02 HISTORY — DX: Hypothyroidism, unspecified: E03.9

## 2016-01-02 SURGERY — ESOPHAGOGASTRODUODENOSCOPY (EGD) WITH PROPOFOL
Anesthesia: Monitor Anesthesia Care | Wound class: Clean Contaminated

## 2016-01-02 MED ORDER — FENTANYL CITRATE (PF) 100 MCG/2ML IJ SOLN: 25.0000 ug | INTRAMUSCULAR | Status: DC | PRN

## 2016-01-02 MED ORDER — ACETAMINOPHEN 160 MG/5ML PO SOLN: 325.0000 mg | ORAL | Status: DC | PRN

## 2016-01-02 MED ORDER — DEXAMETHASONE SODIUM PHOSPHATE 4 MG/ML IJ SOLN: 8.0000 mg | Freq: Once | INTRAMUSCULAR | Status: DC | PRN

## 2016-01-02 MED ORDER — LACTATED RINGERS IV SOLN: 500.0000 mL | INTRAVENOUS | Status: DC

## 2016-01-02 MED ORDER — LIDOCAINE HCL (CARDIAC) 20 MG/ML IV SOLN
INTRAVENOUS | Status: DC | PRN
  Administered 2016-01-02: 50 mg via INTRAVENOUS

## 2016-01-02 MED ORDER — LACTATED RINGERS IV SOLN
INTRAVENOUS | Status: DC
  Administered 2016-01-02: 12:00:00 via INTRAVENOUS

## 2016-01-02 MED ORDER — ACETAMINOPHEN 325 MG PO TABS: 325.0000 mg | ORAL_TABLET | ORAL | Status: DC | PRN

## 2016-01-02 MED ORDER — PROPOFOL 10 MG/ML IV BOLUS
INTRAVENOUS | Status: DC | PRN
  Administered 2016-01-02 (×4): 50 mg via INTRAVENOUS

## 2016-01-02 SURGICAL SUPPLY — 32 items
BALLN DILATOR 10-12 8 (BALLOONS)
BALLN DILATOR 12-15 8 (BALLOONS)
BALLN DILATOR 15-18 8 (BALLOONS)
BALLN DILATOR CRE 0-12 8 (BALLOONS)
BALLN DILATOR ESOPH 8 10 CRE (MISCELLANEOUS) IMPLANT
BALLOON DILATOR 12-15 8 (BALLOONS) IMPLANT
BALLOON DILATOR 15-18 8 (BALLOONS) IMPLANT
BALLOON DILATOR CRE 0-12 8 (BALLOONS) IMPLANT
BLOCK BITE 60FR ADLT L/F GRN (MISCELLANEOUS) ×3 IMPLANT
CANISTER SUCT 1200ML W/VALVE (MISCELLANEOUS) ×3 IMPLANT
CLIP HMST 235XBRD CATH ROT (MISCELLANEOUS) IMPLANT
CLIP RESOLUTION 360 11X235 (MISCELLANEOUS)
FCP ESCP3.2XJMB 240X2.8X (MISCELLANEOUS)
FORCEPS BIOP RAD 4 LRG CAP 4 (CUTTING FORCEPS) IMPLANT
FORCEPS BIOP RJ4 240 W/NDL (MISCELLANEOUS)
FORCEPS ESCP3.2XJMB 240X2.8X (MISCELLANEOUS) IMPLANT
GOWN CVR UNV OPN BCK APRN NK (MISCELLANEOUS) ×2 IMPLANT
GOWN ISOL THUMB LOOP REG UNIV (MISCELLANEOUS) ×4
INJECTOR VARIJECT VIN23 (MISCELLANEOUS) IMPLANT
KIT DEFENDO VALVE AND CONN (KITS) IMPLANT
KIT ENDO PROCEDURE OLY (KITS) ×3 IMPLANT
MARKER SPOT ENDO TATTOO 5ML (MISCELLANEOUS) IMPLANT
PAD GROUND ADULT SPLIT (MISCELLANEOUS) IMPLANT
RETRIEVER NET PLAT FOOD (MISCELLANEOUS) IMPLANT
SNARE SHORT THROW 13M SML OVAL (MISCELLANEOUS) IMPLANT
SNARE SHORT THROW 30M LRG OVAL (MISCELLANEOUS) IMPLANT
SPOT EX ENDOSCOPIC TATTOO (MISCELLANEOUS)
SYR INFLATION 60ML (SYRINGE) IMPLANT
TRAP ETRAP POLY (MISCELLANEOUS) IMPLANT
VARIJECT INJECTOR VIN23 (MISCELLANEOUS)
WATER STERILE IRR 250ML POUR (IV SOLUTION) ×3 IMPLANT
WIRE CRE 18-20MM 8CM F G (MISCELLANEOUS) IMPLANT

## 2016-01-02 NOTE — Anesthesia Preprocedure Evaluation (Signed)
Anesthesia Evaluation  Patient identified by MRN, date of birth, ID band Patient awake    Reviewed: Allergy & Precautions, H&P , NPO status , Patient's Chart, lab work & pertinent test results, reviewed documented beta blocker date and time   Airway Mallampati: III  TM Distance: >3 FB Neck ROM: full    Dental no notable dental hx.    Pulmonary former smoker,    Pulmonary exam normal breath sounds clear to auscultation       Cardiovascular Exercise Tolerance: Good negative cardio ROS   Rhythm:regular Rate:Normal     Neuro/Psych Seizures -, Well Controlled,  negative psych ROS   GI/Hepatic negative GI ROS, Neg liver ROS,   Endo/Other  Hypothyroidism   Renal/GU negative Renal ROS  negative genitourinary   Musculoskeletal   Abdominal   Peds  Hematology negative hematology ROS (+)   Anesthesia Other Findings   Reproductive/Obstetrics negative OB ROS                             Anesthesia Physical Anesthesia Plan  ASA: II  Anesthesia Plan: MAC   Post-op Pain Management:    Induction:   Airway Management Planned:   Additional Equipment:   Intra-op Plan:   Post-operative Plan:   Informed Consent: I have reviewed the patients History and Physical, chart, labs and discussed the procedure including the risks, benefits and alternatives for the proposed anesthesia with the patient or authorized representative who has indicated his/her understanding and acceptance.     Plan Discussed with: CRNA  Anesthesia Plan Comments:         Anesthesia Quick Evaluation

## 2016-01-02 NOTE — Anesthesia Postprocedure Evaluation (Signed)
Anesthesia Post Note  Patient: Colleen Grimes  Procedure(s) Performed: Procedure(s) (LRB): ESOPHAGOGASTRODUODENOSCOPY (EGD) WITH PROPOFOL (N/A)  Patient location during evaluation: PACU Anesthesia Type: MAC Level of consciousness: awake and alert Pain management: pain level controlled Vital Signs Assessment: post-procedure vital signs reviewed and stable Respiratory status: spontaneous breathing, nonlabored ventilation and respiratory function stable Cardiovascular status: stable and blood pressure returned to baseline Anesthetic complications: no    Latorya Bautch D Kaniya Trueheart

## 2016-01-02 NOTE — Transfer of Care (Signed)
Immediate Anesthesia Transfer of Care Note  Patient: Colleen Grimes  Procedure(s) Performed: Procedure(s): ESOPHAGOGASTRODUODENOSCOPY (EGD) WITH PROPOFOL (N/A)  Patient Location: PACU  Anesthesia Type: MAC  Level of Consciousness: awake, alert  and patient cooperative  Airway and Oxygen Therapy: Patient Spontanous Breathing and Patient connected to supplemental oxygen  Post-op Assessment: Post-op Vital signs reviewed, Patient's Cardiovascular Status Stable, Respiratory Function Stable, Patent Airway and No signs of Nausea or vomiting  Post-op Vital Signs: Reviewed and stable  Complications: No apparent anesthesia complications

## 2016-01-02 NOTE — Op Note (Signed)
[facility] Gastroenterology Patient Name: Colleen Grimes Procedure Date: 01/02/2016 12:27 PM MRN: [id number] Account #: [id number] Date of Birth: [birth year] Admit Type: Outpatient Age: 35 Room: [facility] OR ROOM 01 Gender: Female Note Status: Finalized Procedure:            Upper GI endoscopy Indications:          Heartburn Providers:            Lucilla Lame MD, MD Medicines:            Propofol per Anesthesia Complications:        No immediate complications. Procedure:            Pre-Anesthesia Assessment:                       - Prior to the procedure, a History and Physical was                        performed, and patient medications and allergies were                        reviewed. The patient's tolerance of previous                        anesthesia was also reviewed. The risks and benefits of                        the procedure and the sedation options and risks were                        discussed with the patient. All questions were                        answered, and informed consent was obtained. Prior                        Anticoagulants: The patient has taken no previous                        anticoagulant or antiplatelet agents. ASA Grade                        Assessment: II - A patient with mild systemic disease.                        After reviewing the risks and benefits, the patient was                        deemed in satisfactory condition to undergo the                        procedure.                       After obtaining informed consent, the endoscope was                        passed under direct vision. Throughout the procedure,                        the patient's blood pressure, pulse, and  oxygen                        saturations were monitored continuously. The Olympus                        GIF H180J colonscope [id number]) was introduced                        through the mouth, and advanced to the second part of           duodenum. The upper GI endoscopy was accomplished                        without difficulty. The patient tolerated the procedure                        well. Findings:      The Z-line was irregular and was found at the gastroesophageal junction.      The entire examined stomach was normal.      The examined duodenum was normal. Impression:           - Z-line irregular, at the gastroesophageal junction.                       - Normal stomach.                       - Normal examined duodenum.                       - No specimens collected. Recommendation:       - Discharge patient to home.                       - Resume previous diet.                       - Continue present medications. Procedure Code(s):    --- Professional ---                       [phone], Esophagogastroduodenoscopy, flexible, transoral;                        diagnostic, including collection of specimen(s) by                        brushing or washing, when performed (separate procedure) Diagnosis Code(s):    --- Professional ---                       R12, Heartburn CPT copyright 2016 American Medical Association. All rights reserved. The codes documented in this report are preliminary and upon coder review may  be revised to meet current compliance requirements. Lucilla Lame MD, MD 01/02/2016 12:46:26 PM This report has been signed electronically. Number of Addenda: 0 Note Initiated On: 01/02/2016 12:27 PM Total Procedure Duration: 0 hours 3 minutes 33 seconds       [facility]

## 2016-01-02 NOTE — Anesthesia Procedure Notes (Signed)
Procedure Name: MAC Performed by: Calven Gilkes Pre-anesthesia Checklist: Patient identified, Emergency Drugs available, Suction available, Timeout performed and Patient being monitored Patient Re-evaluated:Patient Re-evaluated prior to inductionOxygen Delivery Method: Nasal cannula Placement Confirmation: positive ETCO2     

## 2016-01-02 NOTE — H&P (Signed)
  Colleen Lame, MD [address] [place], [place] [address] Phone: [phone] Fax : [phone]  Primary Care Physician:  Pcp Not In System Primary Gastroenterologist:  Dr. Allen Norris  Pre-Procedure History & Physical: HPI:  Colleen Grimes is a 35 y.o. female is here for an endoscopy.   Past Medical History:  Diagnosis Date  . Acid reflux   . Anemia   . Dyspnea    occasionally  . Epilepsy ([place])   . Hypothyroidism   . Seizures ([place])    no seizure in several years    Past Surgical History:  Procedure Laterality Date  . CESAREAN SECTION    . TONSILLECTOMY    . TUBAL LIGATION      Prior to Admission medications   Medication Sig Start Date End Date Taking? Authorizing Provider  cyclobenzaprine (FLEXERIL) 10 MG tablet Take 10 mg by mouth 3 (three) times daily as needed for muscle spasms.   Yes Historical Provider, MD  meloxicam (MOBIC) 7.5 MG tablet Take 7.5 mg by mouth daily.   Yes Historical Provider, MD  Vitamin D, Ergocalciferol, (DRISDOL) 50000 units CAPS capsule Take 50,000 Units by mouth every 7 (seven) days.   Yes Historical Provider, MD  escitalopram (LEXAPRO) 10 MG tablet Take by mouth. 12/04/15 03/03/16  Historical Provider, MD  levothyroxine (SYNTHROID, LEVOTHROID) 25 MCG tablet Take by mouth. 12/08/15 12/07/16  Historical Provider, MD  omeprazole (PRILOSEC) 40 MG capsule Take by mouth.    Historical Provider, MD    Allergies as of 12/10/2015 - Review Complete 12/09/2015  Allergen Reaction Noted  . Dilantin [phenytoin sodium extended]  07/09/2013    Family History  Problem Relation Age of Onset  . Ovarian cancer Mother   . Vaginal cancer Mother   . Breast cancer Paternal Aunt   . Diabetes Paternal Grandmother   . Diabetes Paternal Grandfather   . Colon cancer Neg Hx   . Heart disease Neg Hx     Social History   Social History  . Marital status: Married    Spouse name: N/A  . Number of children: N/A  . Years of education: N/A   Occupational  History  . Not on file.   Social History Main Topics  . Smoking status: Former Research scientist (life sciences)  . Smokeless tobacco: Never Used  . Alcohol use No  . Drug use: No  . Sexual activity: Yes    Birth control/ protection: Surgical   Other Topics Concern  . Not on file   Social History Narrative  . No narrative on file    Review of Systems: See HPI, otherwise negative ROS  Physical Exam: LMP 12/12/2015  General:   Alert,  pleasant and cooperative in NAD Head:  Normocephalic and atraumatic. Neck:  Supple; no masses or thyromegaly. Lungs:  Clear throughout to auscultation.    Heart:  Regular rate and rhythm. Abdomen:  Soft, nontender and nondistended. Normal bowel sounds, without guarding, and without rebound.   Neurologic:  Alert and  oriented x4;  grossly normal neurologically.  Impression/Plan: [name] is here for an endoscopy to be performed for GERD  Risks, benefits, limitations, and alternatives regarding  endoscopy have been reviewed with the patient.  Questions have been answered.  All parties agreeable.   Colleen Lame, MD  01/02/2016, 11:04 AM

## 2016-01-05 ENCOUNTER — Encounter: Payer: Self-pay | Admitting: Gastroenterology

## 2016-01-26 DIAGNOSIS — T148XXA Other injury of unspecified body region, initial encounter: Secondary | ICD-10-CM | POA: Insufficient documentation

## 2016-01-26 DIAGNOSIS — E559 Vitamin D deficiency, unspecified: Secondary | ICD-10-CM | POA: Insufficient documentation

## 2016-01-26 DIAGNOSIS — M797 Fibromyalgia: Secondary | ICD-10-CM | POA: Insufficient documentation

## 2016-07-05 ENCOUNTER — Other Ambulatory Visit: Payer: Self-pay | Admitting: Otolaryngology

## 2016-07-05 DIAGNOSIS — E039 Hypothyroidism, unspecified: Secondary | ICD-10-CM

## 2016-07-13 ENCOUNTER — Ambulatory Visit
Admission: RE | Admit: 2016-07-13 | Discharge: 2016-07-13 | Disposition: A | Discharge status: Home / Self Care | Payer: Medicaid Other | Source: Ambulatory Visit | Attending: Otolaryngology | Admitting: Otolaryngology

## 2016-07-13 ENCOUNTER — Other Ambulatory Visit: Payer: Self-pay | Admitting: Otolaryngology

## 2016-07-13 DIAGNOSIS — E039 Hypothyroidism, unspecified: Secondary | ICD-10-CM | POA: Insufficient documentation

## 2016-07-13 DIAGNOSIS — R1310 Dysphagia, unspecified: Secondary | ICD-10-CM | POA: Diagnosis not present

## 2016-07-26 DIAGNOSIS — M7032 Other bursitis of elbow, left elbow: Secondary | ICD-10-CM | POA: Insufficient documentation

## 2016-08-03 ENCOUNTER — Ambulatory Visit
Admission: RE | Admit: 2016-08-03 | Discharge: 2016-08-03 | Disposition: A | Discharge status: Home / Self Care | Payer: Medicaid Other | Source: Ambulatory Visit | Attending: Family Medicine | Admitting: Family Medicine

## 2016-08-03 ENCOUNTER — Ambulatory Visit: Payer: Medicaid Other | Attending: Otolaryngology

## 2016-08-03 ENCOUNTER — Other Ambulatory Visit: Payer: Self-pay | Admitting: Family Medicine

## 2016-08-03 DIAGNOSIS — R0609 Other forms of dyspnea: Secondary | ICD-10-CM | POA: Diagnosis present

## 2016-08-03 DIAGNOSIS — R918 Other nonspecific abnormal finding of lung field: Secondary | ICD-10-CM | POA: Insufficient documentation

## 2016-08-03 DIAGNOSIS — Z87891 Personal history of nicotine dependence: Secondary | ICD-10-CM | POA: Insufficient documentation

## 2016-09-19 ENCOUNTER — Encounter: Payer: Self-pay | Admitting: Emergency Medicine

## 2016-09-19 ENCOUNTER — Ambulatory Visit
Admission: EM | Admit: 2016-09-19 | Discharge: 2016-09-19 | Disposition: A | Discharge status: Home / Self Care | Payer: Medicaid Other | Attending: Family Medicine | Admitting: Family Medicine

## 2016-09-19 ENCOUNTER — Ambulatory Visit: Payer: Medicaid Other

## 2016-09-19 DIAGNOSIS — E039 Hypothyroidism, unspecified: Secondary | ICD-10-CM | POA: Insufficient documentation

## 2016-09-19 DIAGNOSIS — K219 Gastro-esophageal reflux disease without esophagitis: Secondary | ICD-10-CM | POA: Diagnosis not present

## 2016-09-19 DIAGNOSIS — M25562 Pain in left knee: Secondary | ICD-10-CM | POA: Insufficient documentation

## 2016-09-19 DIAGNOSIS — G40909 Epilepsy, unspecified, not intractable, without status epilepticus: Secondary | ICD-10-CM | POA: Insufficient documentation

## 2016-09-19 DIAGNOSIS — Z87891 Personal history of nicotine dependence: Secondary | ICD-10-CM | POA: Insufficient documentation

## 2016-09-19 MED ORDER — DICLOFENAC SODIUM 75 MG PO TBEC: 75.0000 mg | DELAYED_RELEASE_TABLET | Freq: Two times a day (BID) | ORAL | 0 refills | Status: DC | PRN

## 2016-09-19 MED ORDER — KETOROLAC TROMETHAMINE 60 MG/2ML IM SOLN
60.0000 mg | Freq: Once | INTRAMUSCULAR | Status: AC
  Administered 2016-09-19: 60 mg via INTRAMUSCULAR

## 2016-09-19 NOTE — Discharge Instructions (Signed)
Xray negative.  Take the medication as directed, starting tomorrow.   Take care  Dr. Lacinda Axon

## 2016-09-19 NOTE — ED Triage Notes (Signed)
Patient c/o left knee pain after falling in the [employer] parking lot today.

## 2016-09-19 NOTE — ED Provider Notes (Signed)
[facility]    CSN: [id number] Arrival date & time: 09/19/16  1550  History   Chief Complaint Chief Complaint  Patient presents with  . Knee Pain   HPI  36 year old female presents with complaints of knee pain.  Patient states that she fell earlier today around 1 PM. She was walking and did not see of the sidewalk. She subsequently stumbled and fell directly onto her knees particularly the left knee. She reports severe pain. She took ibuprofen with no relief. No reported swelling. She does report tenderness on the medial side of the knee. Currently moderate to severe. Worse with range of motion. She reports difficulty in ambulating. No reported swelling. No other associates symptoms. No other complaints at this time.  Past Medical History:  Diagnosis Date  . Acid reflux   . Anemia   . Dyspnea    occasionally  . Epilepsy ([place])   . Hypothyroidism   . Seizures ([place])    no seizure in several years   Patient Active Problem List   Diagnosis Date Noted  . Heartburn   . GERD (gastroesophageal reflux disease) 12/09/2015  . Seizures ([place]) 12/09/2015   Past Surgical History:  Procedure Laterality Date  . CESAREAN SECTION    . ESOPHAGOGASTRODUODENOSCOPY (EGD) WITH PROPOFOL N/A 01/02/2016   Procedure: ESOPHAGOGASTRODUODENOSCOPY (EGD) WITH PROPOFOL;  Surgeon: Lucilla Lame, MD;  Location: [place];  Service: Endoscopy;  Laterality: N/A;  . TONSILLECTOMY    . TUBAL LIGATION      OB History    Gravida Para Term Preterm AB Living   3 1 1     3    SAB TAB Ectopic Multiple Live Births           3     Home Medications    Prior to Admission medications   Medication Sig Start Date End Date Taking? Authorizing Provider  busPIRone (BUSPAR) 10 MG tablet Take 10 mg by mouth 2 (two) times daily.   Yes [provider]  cyclobenzaprine (FLEXERIL) 10 MG tablet Take 10 mg by mouth 3 (three) times daily as needed for muscle spasms.    [provider]    diclofenac (VOLTAREN) 75 MG EC tablet Take 1 tablet (75 mg total) by mouth 2 (two) times daily as needed. 09/19/16   Coral Spikes, DO  escitalopram (LEXAPRO) 10 MG tablet Take by mouth. 12/04/15 03/03/16  [provider]  omeprazole (PRILOSEC) 40 MG capsule Take by mouth.    [provider]  Vitamin D, Ergocalciferol, (DRISDOL) 50000 units CAPS capsule Take 50,000 Units by mouth every 7 (seven) days.    [provider]    Family History Family History  Problem Relation Age of Onset  . Ovarian cancer Mother   . Vaginal cancer Mother   . Breast cancer Paternal Aunt   . Diabetes Paternal Grandmother   . Diabetes Paternal Grandfather   . Colon cancer Neg Hx   . Heart disease Neg Hx     Social History Social History  Substance Use Topics  . Smoking status: Former Research scientist (life sciences)  . Smokeless tobacco: Never Used  . Alcohol use No     Allergies   Dilantin [phenytoin sodium extended]   Review of Systems Review of Systems  Constitutional: Negative.   Musculoskeletal:       Left knee pain.   Physical Exam Triage Vital Signs ED Triage Vitals  Enc Vitals Group     BP 09/19/16 1559 126/69  Pulse Rate 09/19/16 1559 75     Resp 09/19/16 1559 16     Temp 09/19/16 1559 97.4 F (36.3 C)     Temp Source 09/19/16 1559 Oral     SpO2 09/19/16 1559 100 %     Weight 09/19/16 1556 225 lb (102.1 kg)     Height 09/19/16 1556 5\' 7"  (1.702 m)     Head Circumference --      Peak Flow --      Pain Score 09/19/16 1557 8     Pain Loc --      Pain Edu? --      Excl. in [place]? --    Updated Vital Signs BP 126/69 (BP Location: Left Arm)   Pulse 75   Temp 97.4 F (36.3 C) (Oral)   Resp 16   Ht 5\' 7"  (1.702 m)   Wt 225 lb (102.1 kg)   LMP 09/17/2016 (Exact Date)   SpO2 100%   BMI 35.24 kg/m   Physical Exam  Constitutional: She is oriented to person, place, and time. She appears well-developed. No distress.  HENT:  Head: Normocephalic and atraumatic.   Cardiovascular: Normal rate and regular rhythm.   Pulmonary/Chest: Effort normal and breath sounds normal.  Abdominal: She exhibits no distension.  Musculoskeletal:  Left knee - normal inspection. No appreciable effusion. She is morbidly obese so it is difficult to tell. Medial joint line tenderness. Decreased range of motion in all planes secondary to pain.  Neurological: She is alert and oriented to person, place, and time.  Psychiatric: She has a normal mood and affect.  Vitals reviewed.  UC Treatments / Results  Labs (all labs ordered are listed, but only abnormal results are displayed) Labs Reviewed - No data to display  EKG  EKG Interpretation None       Radiology Dg Knee Complete 4 Views Left  Result Date: 09/19/2016 CLINICAL DATA:  Fall, left knee pain EXAM: LEFT KNEE - COMPLETE 4+ VIEW COMPARISON:  None. FINDINGS: No evidence of fracture, dislocation, or joint effusion. No evidence of arthropathy or other focal bone abnormality. Soft tissues are unremarkable. IMPRESSION: Negative. Electronically Signed   By: Rolm Baptise M.D.   On: 09/19/2016 16:24    Procedures Procedures (including critical care time)  Medications Ordered in UC Medications  ketorolac (TORADOL) injection 60 mg (60 mg Intramuscular Given 09/19/16 1636)     Initial Impression / Assessment and Plan / UC Course  I have reviewed the triage vital signs and the nursing notes.  Pertinent labs & imaging results that were available during my care of the patient were reviewed by me and considered in my medical decision making (see chart for details).  36 year old female presents with knee pain after suffering a fall. X-ray negative. I suspect this is all likely musculoskeletal pain. She could have an underlying meniscal tear but it's too early to assess. I informed her that she may need imaging if her pain continues to persist. Treating with NSAIDs as indicated below. Follow-up closely with PCP.  Final  Clinical Impressions(s) / UC Diagnoses   Final diagnoses:  Acute pain of left knee    New Prescriptions Discharge Medication List as of 09/19/2016  4:36 PM    START taking these medications   Details  diclofenac (VOLTAREN) 75 MG EC tablet Take 1 tablet (75 mg total) by mouth 2 (two) times daily as needed., Starting Sun 09/19/2016, Normal         [place], [place]  G, DO 09/19/16 1658

## 2016-09-19 NOTE — ED Notes (Signed)
Patient shows no signs of adverse reaction to medication at this time.  

## 2016-11-10 DIAGNOSIS — M79671 Pain in right foot: Secondary | ICD-10-CM | POA: Insufficient documentation

## 2017-03-16 ENCOUNTER — Ambulatory Visit: Payer: Medicaid Other | Attending: Internal Medicine | Admitting: Physical Therapy

## 2017-03-21 ENCOUNTER — Encounter: Payer: Self-pay | Admitting: Obstetrics and Gynecology

## 2017-03-21 ENCOUNTER — Ambulatory Visit (INDEPENDENT_AMBULATORY_CARE_PROVIDER_SITE_OTHER): Payer: Medicaid Other | Admitting: Obstetrics and Gynecology

## 2017-03-21 VITALS — BP 110/70 | HR 101 | Ht 66.0 in | Wt 267.0 lb

## 2017-03-21 DIAGNOSIS — Z7183 Encounter for nonprocreative genetic counseling: Secondary | ICD-10-CM | POA: Diagnosis not present

## 2017-03-21 DIAGNOSIS — Z124 Encounter for screening for malignant neoplasm of cervix: Secondary | ICD-10-CM | POA: Diagnosis not present

## 2017-03-21 DIAGNOSIS — Z8041 Family history of malignant neoplasm of ovary: Secondary | ICD-10-CM | POA: Diagnosis not present

## 2017-03-21 DIAGNOSIS — N939 Abnormal uterine and vaginal bleeding, unspecified: Secondary | ICD-10-CM | POA: Diagnosis not present

## 2017-03-21 MED ORDER — MEDROXYPROGESTERONE ACETATE 10 MG PO TABS: 10.0000 mg | ORAL_TABLET | Freq: Every day | ORAL | 0 refills | Status: DC

## 2017-03-21 NOTE — Progress Notes (Signed)
Gynecology Abnormal Uterine Bleeding Initial Evaluation   Chief Complaint:  Chief Complaint  Patient presents with  . Menstrual Problem    second cycle this month/clots/cramping  . Menorrhagia    History of Present Illness:    Paitient is a 36 y.o. G3P1003 who LMP was Patient's last menstrual period was 02/25/2017 (exact date)., presents today for a problem visit.  She complains of abnormal uterine bleeding that  began 03/10/2017 and its severity is described as severe.  She has previously had regular periods every 28 days and they are associated with no menstrual cramping, but last up to 7 days.  She has used the following for attempts at control: tampon and pad. The patient is sexually active. She currently uses Female sterilization: Present: yes for contraception.   Previous evaluation: none.  This most recent menstrual cycle started on 03/10/2017, started with 2 day of dark brown bleeding then increased to bleeding with passage of silver dollar size clots as well as cramping and back pain.    LMP: Patient's last menstrual period was 02/25/2017 (exact date).  Heavy Menses: yes Clots: yes Intermenstrual Bleeding: no Postcoital Bleeding: no Dysmenorrhea: yes but only with current episode  Her mother is deceased of ovarian cancer, no other family history of ovarian or breast cancer.  She has not had prior genetic testing.  Her surgical history is notable for 3 cesarean sections as well as postoperative wound complications.  Review of Systems: Review of Systems  Constitutional: Negative for chills, fever, malaise/fatigue and weight loss.  HENT: Negative for congestion.   Respiratory: Negative for cough and shortness of breath.   Cardiovascular: Negative for chest pain and palpitations.  Gastrointestinal: Positive for abdominal pain. Negative for constipation, diarrhea, heartburn, nausea and vomiting.  Genitourinary: Negative for dysuria, frequency and urgency.  Skin: Negative  for itching and rash.  Neurological: Negative for dizziness and headaches.  Endo/Heme/Allergies: Negative for polydipsia.  Psychiatric/Behavioral: Negative for depression.    Past Medical History:  Past Medical History:  Diagnosis Date  . Acid reflux   . Anemia   . Dyspnea    occasionally  . Epilepsy ([place])   . Hypothyroidism   . Seizures ([place])    no seizure in several years    Past Surgical History:  Past Surgical History:  Procedure Laterality Date  . CESAREAN SECTION    . ESOPHAGOGASTRODUODENOSCOPY (EGD) WITH PROPOFOL N/A 01/02/2016   Procedure: ESOPHAGOGASTRODUODENOSCOPY (EGD) WITH PROPOFOL;  Surgeon: Lucilla Lame, MD;  Location: [place];  Service: Endoscopy;  Laterality: N/A;  . TONSILLECTOMY    . TUBAL LIGATION      Obstetric History: G3P1003  Family History:  Family History  Problem Relation Age of Onset  . Ovarian cancer Mother   . Vaginal cancer Mother   . Breast cancer Paternal Aunt   . Diabetes Paternal Grandmother   . Diabetes Paternal Grandfather   . Colon cancer Neg Hx   . Heart disease Neg Hx     Social History:  Social History   Socioeconomic History  . Marital status: Married    Spouse name: Not on file  . Number of children: Not on file  . Years of education: Not on file  . Highest education level: Not on file  Social Needs  . Financial resource strain: Not on file  . Food insecurity - worry: Not on file  . Food insecurity - inability: Not on file  . Transportation needs - medical: Not on file  .  Transportation needs - non-medical: Not on file  Occupational History  . Not on file  Tobacco Use  . Smoking status: Former Research scientist (life sciences)  . Smokeless tobacco: Never Used  Substance and Sexual Activity  . Alcohol use: No  . Drug use: No  . Sexual activity: Yes    Birth control/protection: Surgical  Other Topics Concern  . Not on file  Social History Narrative  . Not on file    Allergies:  Allergies  Allergen Reactions  .  Dilantin [Phenytoin Sodium Extended]     Medications: Prior to Admission medications   Medication Sig Start Date End Date Taking? Authorizing Provider  busPIRone (BUSPAR) 10 MG tablet Take 10 mg by mouth 2 (two) times daily.   Yes [provider]  cyclobenzaprine (FLEXERIL) 10 MG tablet Take 10 mg by mouth 3 (three) times daily as needed for muscle spasms.   Yes [provider]  escitalopram (LEXAPRO) 10 MG tablet Take by mouth. 12/04/15 03/21/17 Yes [provider]  omeprazole (PRILOSEC) 40 MG capsule Take by mouth.   Yes [provider]    Physical Exam Vitals:  Vitals:   03/21/17 1342  BP: 110/70  Pulse: (!) 101   Patient's last menstrual period was 02/25/2017 (exact date).  General: NAD HEENT: normocephalic, anicteric Pulmonary: No increased work of breathing Abdomen: NABS, soft,tenderness at fundus during bimanual exam, non-distended.  Umbilicus without lesions.  No hepatomegaly, splenomegaly or masses palpable. No evidence of hernia  Genitourinary:  External: Normal external female genitalia.  Normal urethral meatus, normal  Bartholin's and Skene's glands.    Vagina: Normal vaginal mucosa, no evidence of prolapse.    Cervix: Grossly normal in appearance, no bleeding  Uterus: Enlarged, non-mobile, irregular contour.  No CMT  Adnexa: ovaries non-enlarged, no adnexal masses  Rectal: deferred  Lymphatic: no evidence of inguinal lymphadenopathy Extremities: no edema, erythema, or tenderness Neurologic: Grossly intact Psychiatric: mood appropriate, affect full  Female chaperone present for pelvic portions of the physical exam  Assessment: 36 y.o. G3P1003 with abnormal uterine bleeding  Plan: Problem List Items Addressed This Visit    None    Visit Diagnoses    Abnormal uterine bleeding    -  Primary   Relevant Orders   US PELVIS TRANSVANGINAL NON-OB (TV ONLY)   TSH+Prl+FSH+TestT+LH+DHEA S...   Estradiol   Cervical cancer screening        Relevant Orders   PapIG, HPV, rfx 16/18   Family history of ovarian cancer       Relevant Orders   US PELVIS TRANSVANGINAL NON-OB (TV ONLY)       1) Discussed management options for abnormal uterine bleeding including expectant, NSAIDs, tranexamic acid (Lysteda), oral progesterone (Provera, norethindrone, megace), Depo Provera, Levonorgestrel containing IUD, endometrial ablation (Novasure) or hysterectomy as definitive surgical management.  Discussed risks and benefits of each method.   Final management decision will hinge on results of patient's work up and whether an underlying etiology for the patients bleeding symptoms can be discerned.  We will conduct a basic work up examining using the PALM-COIEN classification system.  Prior cervical cytology was unavailable for review so was repeated at today's visit.  The role of unopposed estrogen in the development of endometrial hyperplasia or carcinoma is discussed.  The risk of endometrial hyperplasia is linearly correlated with increasing BMI given the production of estrone by adipose tissue.  In the meantime the patient opts to trial provera while we await results of her ultrasound and labs.  Printed  patient education handouts were given to the patient to review at home.  Bleeding precautions reviewed.  - TVUS - labs and pap today   2) Family History of Ovarian Cancer- mother deceased ovarian cancer, did not have BRCA testing.  I offered referral to genetic counseling for discussion MyRisk testing because of her significant family history of breast and/or ovarian cancer.  If a mutation is found, the patient would be advised of an increased risk of breast cancer and ovarian cancer.  If a mutation is identified other family members could be tested and would have the opportunity to take advantage of approaches to prevention and early detection of breast and ovarian cancer.    - myriad myrisk testing  3) Return in about 1 week (around 03/28/2017)  for TVUS and follow up.  4) A total of 30 minutes were spent in face-to-face contact with the patient during this encounter with over half of that time devoted to counseling and coordination of care.

## 2017-03-22 DIAGNOSIS — Z1371 Encounter for nonprocreative screening for genetic disease carrier status: Secondary | ICD-10-CM

## 2017-03-22 DIAGNOSIS — Z8041 Family history of malignant neoplasm of ovary: Secondary | ICD-10-CM

## 2017-03-22 HISTORY — DX: Encounter for nonprocreative screening for genetic disease carrier status: Z13.71

## 2017-03-22 HISTORY — DX: Family history of malignant neoplasm of ovary: Z80.41

## 2017-03-24 LAB — PAPIG, HPV, RFX 16/18
HPV, HIGH-RISK: NEGATIVE
PAP SMEAR COMMENT: 0

## 2017-03-24 LAB — TSH+PRL+FSH+TESTT+LH+DHEA S...
17 HYDROXYPROGESTERONE: 18 ng/dL
Androstenedione: 99 ng/dL (ref 41–262)
DHEA SO4: 64.1 ug/dL (ref 57.3–279.2)
FSH: 11.9 m[IU]/mL
LH: 15.2 m[IU]/mL
Prolactin: 9.5 ng/mL (ref 4.8–23.3)
TSH: 7.63 u[IU]/mL — ABNORMAL HIGH (ref 0.450–4.500)
Testosterone, Free: 0.8 pg/mL (ref 0.0–4.2)
Testosterone: 5 ng/dL — ABNORMAL LOW (ref 8–48)

## 2017-03-24 LAB — ESTRADIOL: Estradiol: 46.2 pg/mL

## 2017-04-01 ENCOUNTER — Encounter: Payer: Self-pay | Admitting: Obstetrics and Gynecology

## 2017-04-01 ENCOUNTER — Ambulatory Visit (INDEPENDENT_AMBULATORY_CARE_PROVIDER_SITE_OTHER): Payer: Medicaid Other

## 2017-04-01 ENCOUNTER — Ambulatory Visit: Payer: Medicaid Other | Admitting: Obstetrics and Gynecology

## 2017-04-01 VITALS — BP 134/84 | HR 79 | Ht 66.0 in | Wt 262.0 lb

## 2017-04-01 DIAGNOSIS — N939 Abnormal uterine and vaginal bleeding, unspecified: Secondary | ICD-10-CM

## 2017-04-01 DIAGNOSIS — R7989 Other specified abnormal findings of blood chemistry: Secondary | ICD-10-CM

## 2017-04-01 DIAGNOSIS — Z8041 Family history of malignant neoplasm of ovary: Secondary | ICD-10-CM

## 2017-04-01 NOTE — Progress Notes (Signed)
Gynecology Ultrasound Follow Up  Chief Complaint:  Chief Complaint  Patient presents with  . U/S follow up     History of Present Illness: Patient is a 37 y.o. female who presents today for ultrasound evaluation of an episode of abnormal uterine bleeding.  Ultrasound demonstrates the following findgins Adnexa: normal consistency, no masses or cysts Uterus: Anteverted with endometrial stripe 33m Additional: no free fluid  Bleeding self limited no bleeding since initial episode.    Review of Systems: Review of Systems  Constitutional: Negative for weight loss.  Gastrointestinal: Negative for abdominal pain, constipation and diarrhea.  Skin: Negative.   Endo/Heme/Allergies: Does not bruise/bleed easily.    Past Medical History:  Past Medical History:  Diagnosis Date  . Acid reflux   . Anemia   . BRCA negative 03/2017   MyRisk neg  . Dyspnea    occasionally  . [place]ile[place]   . Family history of ovarian cancer 03/2017   MyRisk neg  . Hypothyroidism   . Seizures ([place]    no seizure in several years    Past Surgical History:  Past Surgical History:  Procedure Laterality Date  . CESAREAN SECTION    . ESOPHAGOGASTRODUODENOSCOPY (EGD) WITH PROPOFOL N/A 01/02/2016   Procedure: ESOPHAGOGASTRODUODENOSCOPY (EGD) WITH PROPOFOL;  Surgeon: DLucilla Lame MD;  [facility]  Service: Endoscopy;  Laterality: N/A;  . TONSILLECTOMY    . TUBAL LIGATION      Gynecologic History:  Patient's last menstrual period was 02/27/2017.  Family History:  Family History  Problem Relation Age of Onset  . Ovarian cancer Mother   . Vaginal cancer Mother   . Breast cancer Paternal Aunt   . Diabetes Paternal Grandmother   . Diabetes Paternal Grandfather   . Colon cancer Neg Hx   . Heart disease Neg Hx     Social History:  Social History   Socioeconomic History  . Marital status: Married    Spouse name: Not on file  . Number of children: Not on file  . Years of  education: Not on file  . Highest education level: Not on file  Social Needs  . Financial resource strain: Not on file  . Food insecurity - worry: Not on file  . Food insecurity - inability: Not on file  . Transportation needs - medical: Not on file  . Transportation needs - non-medical: Not on file  Occupational History  . Not on file  Tobacco Use  . Smoking status: Former SResearch scientist (life sciences) . Smokeless tobacco: Never Used  Substance and Sexual Activity  . Alcohol use: No  . Drug use: No  . Sexual activity: Yes    Birth control/protection: Surgical  Other Topics Concern  . Not on file  Social History Narrative  . Not on file    Allergies:  Allergies  Allergen Reactions  . Dilantin [Phenytoin Sodium Extended]     Medications: Prior to Admission medications   Medication Sig Start Date End Date Taking? Authorizing Provider  busPIRone (BUSPAR) 10 MG tablet Take 10 mg by mouth 2 (two) times daily.    [provider]  cyclobenzaprine (FLEXERIL) 10 MG tablet Take 10 mg by mouth 3 (three) times daily as needed for muscle spasms.    [provider]  escitalopram (LEXAPRO) 10 MG tablet Take by mouth. 12/04/15 03/21/17  [provider]  medroxyPROGESTERone (PROVERA) 10 MG tablet Take 1 tablet (10 mg total) by mouth daily for 10 days. 03/21/17 03/31/17  SMalachy Mood  MD  omeprazole (PRILOSEC) 40 MG capsule Take by mouth.    [provider]    Physical Exam Vitals: There were no vitals taken for this visit.  General: NAD HEENT: normocephalic, anicteric Pulmonary: No increased work of breathing Extremities: no edema, erythema, or tenderness Neurologic: Grossly intact, normal gait Psychiatric: mood appropriate, affect full   Assessment: 37 y.o. G3P1003 AUB and elevated screening TSH  Plan: Problem List Items Addressed This Visit    None    Visit Diagnoses    Elevated TSH    -  Primary   Relevant Orders   Thyroid Panel With TSH (Completed)    Abnormal uterine bleeding       Relevant Orders   Thyroid Panel With TSH (Completed)      1) Elevated screening TSH - recheck full thyroid panel to see if hypothyroid which may be underlying reason for the patient's AUB  2) AUB - structurally normal uterus normal endometrial stripe.  We discussed management option, and as this was a one time self limited episode the most likely cause is an anovulatory cycle.  Will monitor expectantly for now.  Discussed hormonal management options, ablation, and hysterectomy as alternatives.  If evidence of hypothyroidism on final labs will start synthroid.  3). A total of 15 minutes were spent in face-to-face contact with the patient during this encounter with over half of that time devoted to counseling and coordination of care.

## 2017-04-02 LAB — THYROID PANEL WITH TSH
FREE THYROXINE INDEX: 1.7 (ref 1.2–4.9)
T3 UPTAKE RATIO: 24 % (ref 24–39)
T4 TOTAL: 7.2 ug/dL (ref 4.5–12.0)
TSH: 5.71 u[IU]/mL — AB (ref 0.450–4.500)

## 2017-04-04 ENCOUNTER — Encounter: Payer: Self-pay | Admitting: Obstetrics and Gynecology

## 2017-04-04 DIAGNOSIS — Z1371 Encounter for nonprocreative screening for genetic disease carrier status: Secondary | ICD-10-CM | POA: Insufficient documentation

## 2017-06-09 DIAGNOSIS — J189 Pneumonia, unspecified organism: Secondary | ICD-10-CM | POA: Insufficient documentation

## 2017-06-24 DIAGNOSIS — R0603 Acute respiratory distress: Secondary | ICD-10-CM | POA: Insufficient documentation

## 2017-12-17 ENCOUNTER — Ambulatory Visit
Admission: EM | Admit: 2017-12-17 | Discharge: 2017-12-17 | Disposition: A | Discharge status: Home / Self Care | Payer: Medicaid Other

## 2017-12-17 ENCOUNTER — Encounter: Payer: Self-pay | Admitting: Gynecology

## 2017-12-17 ENCOUNTER — Other Ambulatory Visit: Payer: Self-pay

## 2017-12-17 DIAGNOSIS — R600 Localized edema: Secondary | ICD-10-CM

## 2017-12-17 DIAGNOSIS — R609 Edema, unspecified: Secondary | ICD-10-CM

## 2017-12-17 MED ORDER — FUROSEMIDE 20 MG PO TABS: 20.0000 mg | ORAL_TABLET | Freq: Every day | ORAL | 0 refills | Status: DC

## 2017-12-17 NOTE — Discharge Instructions (Signed)
Please wear compression stockings on bilateral lower extremities, take Lasix as prescribed and keep lower extremities elevated.  If no improvement to 3 days call PCP to schedule follow-up appointment.  If any increasing pain, fevers warmth or redness in the lower extremities, chest pain or shortness of breath go to the emergency department.

## 2017-12-17 NOTE — ED Triage Notes (Signed)
Patient c/o bilateral leg swelling x 3 days. 

## 2017-12-17 NOTE — ED Provider Notes (Signed)
[facility]    CSN: [id number] Arrival date & time: 12/17/17  1520     History   Chief Complaint Chief Complaint  Patient presents with  . Leg Swelling    HPI Colleen Grimes is a 37 y.o. female presents to the urgent care facility for evaluation of bilateral lower extremity swelling.  She said 3 days of swelling.  States she is been on her feet most of the day for the last 3 days working 2 jobs.  She denies any trauma or injury.  No warmth redness or fevers.  She denies any history of blood clots, coagulation disorders, cancer, recent surgery or bedridden illness, smoking.  Patient states both feet ankles and lower legs feel tight and swollen.  Discomfort is moderate.  She denies any chest pain, shortness of breath difficulty breathing, coughing.  No shortness of breath with lying down flat.  Wells DVT score is 0.  HPI  Past Medical History:  Diagnosis Date  . Acid reflux   . Anemia   . BRCA negative 03/2017   MyRisk neg  . Dyspnea    occasionally  . Epilepsy ([place])   . Family history of ovarian cancer 03/2017   MyRisk neg  . Hypothyroidism   . Seizures ([place])    no seizure in several years    Patient Active Problem List   Diagnosis Date Noted  . BRCA negative 04/04/2017  . Heartburn   . GERD (gastroesophageal reflux disease) 12/09/2015  . Seizures ([place]) 12/09/2015    Past Surgical History:  Procedure Laterality Date  . CESAREAN SECTION    . ESOPHAGOGASTRODUODENOSCOPY (EGD) WITH PROPOFOL N/A 01/02/2016   Procedure: ESOPHAGOGASTRODUODENOSCOPY (EGD) WITH PROPOFOL;  Surgeon: Lucilla Lame, MD;  Location: [facility];  Service: Endoscopy;  Laterality: N/A;  . TONSILLECTOMY    . TUBAL LIGATION      OB History    Gravida  3   Para  1   Term  1   Preterm      AB      Living  3     SAB      TAB      Ectopic      Multiple      Live Births  3            Home Medications    Prior to Admission medications   Medication Sig Start  Date End Date Taking? Authorizing Provider  albuterol (PROVENTIL HFA;VENTOLIN HFA) 108 (90 Base) MCG/ACT inhaler Inhale into the lungs. 06/10/17 06/26/18 Yes [provider]  busPIRone (BUSPAR) 10 MG tablet Take 10 mg by mouth 2 (two) times daily.   Yes [provider]  cyclobenzaprine (FLEXERIL) 10 MG tablet Take 10 mg by mouth 3 (three) times daily as needed for muscle spasms.   Yes [provider]  escitalopram (LEXAPRO) 10 MG tablet Take by mouth. 12/04/15 12/17/17 Yes [provider]  meloxicam (MOBIC) 7.5 MG tablet TAKE ONE TABLET BY MOUTH AS NEEDED ONLY FOR PAIN 10/10/17  Yes [provider]  montelukast (SINGULAIR) 10 MG tablet Take by mouth. 11/28/17 11/28/18 Yes [provider]  omeprazole (PRILOSEC) 40 MG capsule Take by mouth.   Yes [provider]  Vitamin D, Ergocalciferol, (DRISDOL) 50000 units CAPS capsule 1 dose weekly x 12 weeks, 12 caps with 1 refill 09/01/17  Yes [provider]  furosemide (LASIX) 20 MG tablet Take 1 tablet (20 mg total) by mouth daily for 4 days. 12/17/17 12/21/17  Duanne Guess, PA-C  medroxyPROGESTERone (PROVERA) 10 MG tablet Take 1 tablet (10 mg total) by mouth daily for 10 days. 03/21/17 03/31/17  Malachy Mood, MD    Family History Family History  Problem Relation Age of Onset  . Ovarian cancer Mother   . Vaginal cancer Mother   . Breast cancer Paternal Aunt   . Diabetes Paternal Grandmother   . Diabetes Paternal Grandfather   . Colon cancer Neg Hx   . Heart disease Neg Hx     Social History Social History   Tobacco Use  . Smoking status: Former Research scientist (life sciences)  . Smokeless tobacco: Never Used  Substance Use Topics  . Alcohol use: No  . Drug use: No     Allergies   Dilantin [phenytoin sodium extended]   Review of Systems Review of Systems  Constitutional: Negative for fever.  Respiratory: Negative for cough and shortness of breath.   Cardiovascular: Positive for leg  swelling. Negative for chest pain.  Musculoskeletal: Negative for gait problem, joint swelling and myalgias.  Skin: Negative for rash and wound.  Neurological: Negative for headaches.     Physical Exam Triage Vital Signs ED Triage Vitals  Enc Vitals Group     BP 12/17/17 1529 129/82     Pulse Rate 12/17/17 1529 94     Resp 12/17/17 1529 18     Temp 12/17/17 1529 97.8 F (36.6 C)     Temp Source 12/17/17 1529 Oral     SpO2 12/17/17 1529 100 %     Weight 12/17/17 1529 270 lb (122.5 kg)     Height 12/17/17 1529 _0  (1.676 m)     Head Circumference --      Peak Flow --      Pain Score 12/17/17 1528 10     Pain Loc --      Pain Edu? --      Excl. in [place]? --    No data found.  Updated Vital Signs BP 129/82 (BP Location: Left Arm)   Pulse 94   Temp 97.8 F (36.6 C) (Oral)   Resp 18   Ht _1  (1.676 m)   Wt 270 lb (122.5 kg)   LMP 12/10/2017   SpO2 100%   BMI 43.58 kg/m   Visual Acuity Right Eye Distance:   Left Eye Distance:   Bilateral Distance:    Right Eye Near:   Left Eye Near:    Bilateral Near:     Physical Exam  Constitutional: She is oriented to person, place, and time. She appears well-developed and well-nourished.  HENT:  Head: Normocephalic and atraumatic.  Eyes: Conjunctivae are normal.  Neck: Normal range of motion.  Cardiovascular: Normal rate, regular rhythm and normal heart sounds.  Pulmonary/Chest: Effort normal. No stridor. No respiratory distress. She has no wheezes. She has no rales.  Musculoskeletal: Normal range of motion.  Patient with bilateral peripheral edema in both feet, ankles and into the lower half of the tib-fib region bilaterally.  1+ pitting edema present throughout the feet ankles and lower legs.  There is no warmth or redness.  Patient has good ankle plantar flexion dorsiflexion bilaterally.  Compartments are soft no significant tenderness with compression to bilateral calves.  Sensation is intact distally.  Neurological: She  is alert and oriented to person, place, and time.  Skin: Skin is warm. No rash noted.  Psychiatric: She has a normal mood and affect. Her behavior is normal. Thought content normal.  UC Treatments / Results  Labs (all labs ordered are listed, but only abnormal results are displayed) Labs Reviewed - No data to display  EKG None  Radiology No results found.  Procedures Procedures (including critical care time)  Medications Ordered in UC Medications - No data to display  Initial Impression / Assessment and Plan / UC Course  I have reviewed the triage vital signs and the nursing notes.  Pertinent labs & imaging results that were available during my care of the patient were reviewed by me and considered in my medical decision making (see chart for details).    37 year old female with mild edema both feet and ankles and lower 50% of both tib-fib regions.  No local tenderness, warmth, redness.  Was DVT score 0.  Patient given a note to remain out of work tomorrow and she has Monday off.  She will rest and elevate lower extremities wear compression stockings.  She is given prescription for Lasix to take for 4 days.  She is encouraged to follow-up with PCP if no improvement Monday.  She understands signs and symptoms to go to the ED for. Final Clinical Impressions(s) / UC Diagnoses   Final diagnoses:  Peripheral edema  Bilateral lower extremity edema     Discharge Instructions     Please wear compression stockings on bilateral lower extremities, take Lasix as prescribed and keep lower extremities elevated.  If no improvement to 3 days call PCP to schedule follow-up appointment.  If any increasing pain, fevers warmth or redness in the lower extremities, chest pain or shortness of breath go to the emergency department.   ED Prescriptions    Medication Sig Dispense Auth. Provider   furosemide (LASIX) 20 MG tablet Take 1 tablet (20 mg total) by mouth daily for 4 days. 4 tablet  Renata Caprice       Duanne Guess, [place] 12/17/17 1607

## 2018-01-26 DIAGNOSIS — R0602 Shortness of breath: Secondary | ICD-10-CM | POA: Insufficient documentation

## 2018-01-26 DIAGNOSIS — R079 Chest pain, unspecified: Secondary | ICD-10-CM | POA: Insufficient documentation

## 2018-01-26 DIAGNOSIS — R0683 Snoring: Secondary | ICD-10-CM | POA: Insufficient documentation

## 2018-01-26 DIAGNOSIS — R5381 Other malaise: Secondary | ICD-10-CM | POA: Insufficient documentation

## 2018-01-26 DIAGNOSIS — R5383 Other fatigue: Secondary | ICD-10-CM | POA: Insufficient documentation

## 2018-03-01 ENCOUNTER — Encounter: Payer: Self-pay | Admitting: Hematology and Oncology

## 2018-03-01 ENCOUNTER — Other Ambulatory Visit: Payer: Self-pay

## 2018-03-01 ENCOUNTER — Other Ambulatory Visit: Payer: Self-pay | Admitting: Hematology and Oncology

## 2018-03-01 ENCOUNTER — Inpatient Hospital Stay: Payer: Medicaid Other

## 2018-03-01 ENCOUNTER — Inpatient Hospital Stay: Payer: Medicaid Other | Attending: Hematology and Oncology | Admitting: Hematology and Oncology

## 2018-03-01 VITALS — BP 119/81 | HR 80 | Temp 96.5°F | Resp 18 | Ht 66.0 in | Wt 268.3 lb

## 2018-03-01 DIAGNOSIS — Z803 Family history of malignant neoplasm of breast: Secondary | ICD-10-CM | POA: Insufficient documentation

## 2018-03-01 DIAGNOSIS — M898X0 Other specified disorders of bone, multiple sites: Secondary | ICD-10-CM | POA: Diagnosis not present

## 2018-03-01 DIAGNOSIS — R748 Abnormal levels of other serum enzymes: Secondary | ICD-10-CM | POA: Insufficient documentation

## 2018-03-01 DIAGNOSIS — R5383 Other fatigue: Secondary | ICD-10-CM | POA: Diagnosis not present

## 2018-03-01 DIAGNOSIS — N920 Excessive and frequent menstruation with regular cycle: Secondary | ICD-10-CM | POA: Insufficient documentation

## 2018-03-01 DIAGNOSIS — D508 Other iron deficiency anemias: Secondary | ICD-10-CM | POA: Diagnosis not present

## 2018-03-01 DIAGNOSIS — D509 Iron deficiency anemia, unspecified: Secondary | ICD-10-CM | POA: Insufficient documentation

## 2018-03-01 DIAGNOSIS — D649 Anemia, unspecified: Secondary | ICD-10-CM

## 2018-03-01 DIAGNOSIS — M898X9 Other specified disorders of bone, unspecified site: Secondary | ICD-10-CM

## 2018-03-01 LAB — CBC WITH DIFFERENTIAL/PLATELET
Abs Immature Granulocytes: 0.01 10*3/uL (ref 0.00–0.07)
Basophils Absolute: 0 10*3/uL (ref 0.0–0.1)
Basophils Relative: 1 %
EOS ABS: 0.2 10*3/uL (ref 0.0–0.5)
Eosinophils Relative: 4 %
HCT: 31.6 % — ABNORMAL LOW (ref 36.0–46.0)
Hemoglobin: 8.9 g/dL — ABNORMAL LOW (ref 12.0–15.0)
Immature Granulocytes: 0 %
Lymphocytes Relative: 32 %
Lymphs Abs: 1.8 10*3/uL (ref 0.7–4.0)
MCH: 19.4 pg — ABNORMAL LOW (ref 26.0–34.0)
MCHC: 28.2 g/dL — ABNORMAL LOW (ref 30.0–36.0)
MCV: 68.8 fL — ABNORMAL LOW (ref 80.0–100.0)
Monocytes Absolute: 0.3 10*3/uL (ref 0.1–1.0)
Monocytes Relative: 6 %
NEUTROS PCT: 57 %
Neutro Abs: 3.2 10*3/uL (ref 1.7–7.7)
Platelets: 433 10*3/uL — ABNORMAL HIGH (ref 150–400)
RBC: 4.59 MIL/uL (ref 3.87–5.11)
RDW: 18.2 % — ABNORMAL HIGH (ref 11.5–15.5)
WBC: 5.5 10*3/uL (ref 4.0–10.5)
nRBC: 0 % (ref 0.0–0.2)

## 2018-03-01 LAB — APTT: aPTT: 32 seconds (ref 24–36)

## 2018-03-01 LAB — URINALYSIS, COMPLETE (UACMP) WITH MICROSCOPIC
BILIRUBIN URINE: NEGATIVE
Bacteria, UA: NONE SEEN
Glucose, UA: NEGATIVE mg/dL
HGB URINE DIPSTICK: NEGATIVE
Ketones, ur: NEGATIVE mg/dL
Leukocytes, UA: NEGATIVE
Nitrite: NEGATIVE
Protein, ur: NEGATIVE mg/dL
RBC / HPF: NONE SEEN RBC/hpf (ref 0–5)
Specific Gravity, Urine: 1.02 (ref 1.005–1.030)
pH: 5.5 (ref 5.0–8.0)

## 2018-03-01 LAB — SAMPLE TO BLOOD BANK

## 2018-03-01 LAB — RETICULOCYTES
IMMATURE RETIC FRACT: 16.9 % — AB (ref 2.3–15.9)
RBC.: 4.63 MIL/uL (ref 3.87–5.11)
Retic Count, Absolute: 71.8 10*3/uL (ref 19.0–186.0)
Retic Ct Pct: 1.6 % (ref 0.4–3.1)

## 2018-03-01 LAB — IRON AND TIBC
Iron: 26 ug/dL — ABNORMAL LOW (ref 28–170)
Saturation Ratios: 5 % — ABNORMAL LOW (ref 10.4–31.8)
TIBC: 509 ug/dL — ABNORMAL HIGH (ref 250–450)
UIBC: 483 ug/dL

## 2018-03-01 LAB — VITAMIN B12: Vitamin B-12: 357 pg/mL (ref 180–914)

## 2018-03-01 LAB — PROTIME-INR
INR: 1.01
Prothrombin Time: 13.2 seconds (ref 11.4–15.2)

## 2018-03-01 LAB — FOLATE: FOLATE: 19.3 ng/mL (ref >5.9)

## 2018-03-01 LAB — PLATELET FUNCTION ASSAY: Collagen / Epinephrine: 120 seconds (ref 0–193)

## 2018-03-01 LAB — FERRITIN: Ferritin: 11 ng/mL (ref 11–307)

## 2018-03-01 MED ORDER — IRON-VITAMIN C 65-125 MG PO TABS: ORAL_TABLET | ORAL | 1 refills | Status: AC

## 2018-03-01 NOTE — Progress Notes (Signed)
[facility] day:  03/01/2018  Chief Complaint: Colleen Grimes is a 37 y.o. female with anemia who is referred in consultation by Eulogio Bear, NP for assessment and management.  HPI:  The patient is unaware of a prior history of anemia.  She was told that she had "abnormal blood counts".  The patient was seen by Eulogio Bear, NP on 02/21/2018.  At that time, she complained of fatigue and staying tired since she had pneumonia back in 05/2017. She notes that she was admitted on 2 different occassions for her pneumonia at [facility]. She had excessive daytime sleepiness. History was + snoring. She is scheduled to be tested for OSAH next week.   Labs reviewed: 07/09/2013:  Hematocrit 38.2, hemoglobin 12.2, MCV 81.6, platelets 380,000, WBC 8300 with an ANC of 6000. 12/04/2015:  Hematocrit 36.3, hemoglobin 11.6, MCV 77.0, platelets 418,000, WBC 7200 with an ANC of 4600. 02/21/2018:  Hematocrit 29.7, hemoglobin 8.5, MCV 69.0, platelets 482,000, WBC 7600 with an ANC of 4900.    Additional labs on 02/21/2018:  Protein 7.0, albumin 3.2, creatinine 0.8, calcium 8.8, alkaline phosphatase 154 (24-110), TSH 6.02 (0.34-5.66), free T4 0.74.  Hepatitis B and C testing was negative in 12/15/2015.  Urinalysis on 10/12/2016 revealed no hematuria.  RF and ANA was negative on 12/04/2015.  Patient denies family history that is significant for any type of hematologic disorders. Her mother passed away at age 13 from ovarian cancer. "Some aunts" on her father's side have breast cancer. To her knowledge, no previous genetic testing has been done in the past.   Symptomatically, patient is fatigued. Patient has daily episodes of exertional dyspnea, with instances of "pressure" in her chest. She notes that this has been going on since she had pneumonia. Patient denies bleeding; no hematochezia, melena, or gross hematuria.  Her menstrual cycles are monthly for "7 long  days". Cycles are described as "extremely heavy". During her normal cycles, she uses 1.5 - 2 boxes of tampons. She experiences associated vertigo symptoms. She has been having frequent headaches over the course of the last year.   Patient bruises easily. She denies excessive ASA or NSAID use. Patient is a G4P4. Three  of her children were delivered by Cesarean section. She states, "I bled for like 6 months after one of my sections".  She notes excessive bleeding after a tonsillectomy at age 75.  She has never taken BCPs.   Patient has experienced intermittent drenching night sweats x 2-3 months. She denies any fevers or significant weight loss. Patient describes generalized bone pain ("all my bones hurt"), mainly in the lower back, for the last year. She has been seen in the past by rheumatology Meda Coffee, MD) and advised that she may have a degree of fibromyalgia. She denies any interval infections.   Patient advises that she maintains an adequate appetite. She is eating well. Weight today is 268 lb 4.8 oz (121.7 kg). Patient maintains a diet rich in iron. She indicates that she eats meat on a consistent basis. She does not like green leafy vegetables. She has craved raw oatmeal for the last year. She denies ice pica. No restless leg symptoms.   She denies significant pain in the clinic today.    Past Medical History:  Diagnosis Date  . Acid reflux   . Anemia   . BRCA negative 03/2017   MyRisk neg  . Dyspnea    occasionally  . Epilepsy ([place])   .  Family history of ovarian cancer 03/2017   MyRisk neg  . Hypothyroidism   . Seizures ([place])    no seizure in several years    Past Surgical History:  Procedure Laterality Date  . CESAREAN SECTION    . ESOPHAGOGASTRODUODENOSCOPY (EGD) WITH PROPOFOL N/A 01/02/2016   Procedure: ESOPHAGOGASTRODUODENOSCOPY (EGD) WITH PROPOFOL;  Surgeon: Lucilla Lame, MD;  Location: [facility];  Service: Endoscopy;  Laterality: N/A;  . TONSILLECTOMY    . TUBAL  LIGATION      Family History  Problem Relation Age of Onset  . Ovarian cancer Mother   . Vaginal cancer Mother   . Cancer Mother   . Breast cancer Paternal Aunt   . Diabetes Paternal Grandmother   . Diabetes Paternal Grandfather   . Colon cancer Neg Hx   . Heart disease Neg Hx     Social History:  reports that she has quit smoking. She has never used smokeless tobacco. She reports that she does not drink alcohol or use drugs. Patient is employed in Press photographer at [employer].  Patient denies known exposures to radiation on toxins. The patient is alone today.  Allergies:  Allergies  Allergen Reactions  . Dilantin [Phenytoin Sodium Extended]     Current Medications: Current Outpatient Medications  Medication Sig Dispense Refill  . albuterol (PROVENTIL HFA;VENTOLIN HFA) 108 (90 Base) MCG/ACT inhaler Inhale into the lungs.    . busPIRone (BUSPAR) 10 MG tablet Take 10 mg by mouth 2 (two) times daily.    . cyclobenzaprine (FLEXERIL) 10 MG tablet Take 10 mg by mouth 3 (three) times daily as needed for muscle spasms.    Marland Kitchen escitalopram (LEXAPRO) 10 MG tablet Take by mouth.    . Ipratropium-Albuterol (COMBIVENT) 20-100 MCG/ACT AERS respimat Inhale 1 puff into the lungs 4 (four) times daily.    Marland Kitchen omeprazole (PRILOSEC) 40 MG capsule Take by mouth.    . furosemide (LASIX) 20 MG tablet Take 1 tablet (20 mg total) by mouth daily for 4 days. 4 tablet 0  . medroxyPROGESTERone (PROVERA) 10 MG tablet Take 1 tablet (10 mg total) by mouth daily for 10 days. 10 tablet 0   No current facility-administered medications for this visit.     Review of Systems:  GENERAL:  Fatigue.  No fevers.  Sweats intermittently x 2-3 months.  No weight loss. PERFORMANCE STATUS (ECOG):  1 HEENT:  No visual changes, runny nose, sore throat, mouth sores or tenderness. Lungs: Shortness of breath with exertion.  No cough.  No hemoptysis. Cardiac:  Occasional pressure in chest.  No chest pain, palpitations,  orthopnea, or PND. GI:  No nausea, vomiting, diarrhea, constipation, melena or hematochezia. GU:  No urgency, frequency, dysuria, or hematuria.  Heavy menses. Musculoskeletal:  "All bones hurt". Back pain.  Arm/leg pain.  No joint pain.  No muscle tenderness. Extremities:  No pain or swelling. Skin:  No rashes or skin changes. Neuro:  Headaches x 1 year.  No numbness or weakness, balance or coordination issues. Endocrine:  No diabetes, thyroid issues, hot flashes or night sweats. Psych:  No mood changes, depression or anxiety. Pain:  No focal pain. Review of systems:  All other systems reviewed and found to be negative.  Physical Exam: Blood pressure 119/81, pulse 80, temperature (!) 96.5 F (35.8 C), temperature source Tympanic, resp. rate 18, height 5' 6"  (1.676 m), weight 268 lb 4.8 oz (121.7 kg), SpO2 100 %. GENERAL:  Well developed, well nourished, woman sitting comfortably in  the exam room in no acute distress. MENTAL STATUS:  Alert and oriented to person, place and time. HEAD:  Long brown hair pulled back.  Normocephalic, atraumatic, face symmetric, no Cushingoid features. EYES:  Blue eyes.  Pupils equal round and reactive to light and accomodation.  No conjunctivitis or scleral icterus. ENT:  Oropharynx clear without lesion.  Tongue normal. Mucous membranes moist.  RESPIRATORY:  Clear to auscultation without rales, wheezes or rhonchi. CARDIOVASCULAR:  Regular rate and rhythm without murmur, rub or gallop. ABDOMEN:  Fully round.  Soft, non-tender, with active bowel sounds, and appreciable no hepatosplenomegaly.  No masses. BACK:  Tender on palpation. SKIN:  Tattoos.  No rashes, ulcers or lesions. EXTREMITIES:  Legs tender.  No edema, no skin discoloration or tenderness.  No palpable cords. LYMPH NODES: No palpable cervical, supraclavicular, axillary or inguinal adenopathy  NEUROLOGICAL: Unremarkable. PSYCH:  Appropriate.   No visits with results within 3 Day(s) from this visit.   Latest known visit with results is:  Office Visit on 04/01/2017  Component Date Value Ref Range Status  . TSH 04/01/2017 5.710* 0.450 - 4.500 uIU/mL Final  . T4, Total 04/01/2017 7.2  4.5 - 12.0 ug/dL Final  . T3 Uptake Ratio 04/01/2017 24  24 - 39 % Final  . Free Thyroxine Index 04/01/2017 1.7  1.2 - 4.9 Final    Assessment:  Colleen Grimes is a 37 y.o. female with a microcytic anemia c/w iron deficiency.  Diet is good.  She notes menorrhagia.  She has raw oatmeal pica.  She has an elevated alkaline phosphatase, a low albumin, and a normal serum protein, calcium and creatinine.  Symptomatically, she is fatigued.  She has had some intermittent sweats.  She notes diffuse bone pain (back > arms> legs).  Plan: 1.  Labs today:  CBC with diff, retic, ferritin, iron studies, B12, folate, SPEP, FLCA, fractionated alkaline phosphatase, hold tube, PT/PTT, PFA, von Willebrand's panel, UA. 2.  Microcytic anemia c/w iron deficiency:  Review of labs reveals development of microcytic RBC indices in 11/2015.  She has subsequently developed anemia.  MCV continues to decrease.  Suspect iron stores are low.  Etiology appears to be heavy menses.  Discuss trial of oral iron with vitamin C.  Rx:  Vitron C q day x 1 week then BID (dis #60).  If unable to tolerate oral iron or hemoglobin and ferritin do not improve, discuss IV iron.  Potential side effects reviewed.  Preauth Venofer. 3.  Low albumen and normal protein:  Concern for an underlying gammopathy (polyclonal versus monoclonal).  Check SPEP, FLCA.  Check urinalysis.  If proteinuria, collect 24 hour urine for UPEP. 4.  Elevated alkaline phosphatase:  Patient notes diffuse bone pain (? fibromyalgia or bone disease).  Check fractionated alkaline phosphatase (liver versus bone).  Discuss consideration of bone scan. 5.  Possible bleeding diathesis:  Patient notes extremely heavy menses.  She bruises easily.  She has a history of increased bleeding  after tonsillectomy and C-section x1.  Check PT, PTT, von Willebrand panel, platelet function assay. 6.  RTC in 1 week for MD assessment, review of work-up and discussion regarding direction of therapy.   Honor Loh, NP  03/01/2018, 8:59 AM   I saw and evaluated the patient, participating in the key portions of the service and reviewing pertinent diagnostic studies and records.  I reviewed the nurse practitioner's note and agree with the findings and the plan.  The assessment and plan were discussed with the patient.  Multiple questions were asked by the patient and answered.   Nolon Stalls, MD 03/01/2018, 8:59 AM

## 2018-03-01 NOTE — Progress Notes (Signed)
Patient here today as new evaluation regarding anemia.  Referred by Eulogio Bear, NP.

## 2018-03-02 ENCOUNTER — Telehealth: Payer: Self-pay | Admitting: *Deleted

## 2018-03-02 LAB — PROTEIN ELECTROPHORESIS, SERUM
A/G Ratio: 1.1 (ref 0.7–1.7)
ALPHA-2-GLOBULIN: 0.7 g/dL (ref 0.4–1.0)
Albumin ELP: 3.5 g/dL (ref 2.9–4.4)
Alpha-1-Globulin: 0.2 g/dL (ref 0.0–0.4)
Beta Globulin: 1.2 g/dL (ref 0.7–1.3)
Gamma Globulin: 1.2 g/dL (ref 0.4–1.8)
Globulin, Total: 3.3 g/dL (ref 2.2–3.9)
Total Protein ELP: 6.8 g/dL (ref 6.0–8.5)

## 2018-03-02 LAB — KAPPA/LAMBDA LIGHT CHAINS
Kappa free light chain: 32.2 mg/L — ABNORMAL HIGH (ref 3.3–19.4)
Kappa, lambda light chain ratio: 1.02 (ref 0.26–1.65)
Lambda free light chains: 31.7 mg/L — ABNORMAL HIGH (ref 5.7–26.3)

## 2018-03-02 LAB — COAG STUDIES INTERP REPORT

## 2018-03-02 LAB — VON WILLEBRAND PANEL
Coagulation Factor VIII: 222 % — ABNORMAL HIGH (ref 56–140)
Ristocetin Co-factor, Plasma: 128 % (ref 50–200)
Von Willebrand Antigen, Plasma: 149 % (ref 50–200)

## 2018-03-02 NOTE — Telephone Encounter (Signed)
Called patient and LVM to inform patient that her labs have not resulted.  It will take about a week and we will discuss with her next Wednesday when she rtc.

## 2018-03-03 LAB — ALKALINE PHOSPHATASE, ISOENZYMES
ALK PHOS: 200 IU/L — AB (ref 39–117)
Alk Phos Bone Fract: 24 % (ref 14–68)
Alk Phos Liver Fract: 76 % (ref 18–85)
Intestinal %: 0 % (ref 0–18)

## 2018-03-08 ENCOUNTER — Other Ambulatory Visit: Payer: Self-pay | Admitting: *Deleted

## 2018-03-08 DIAGNOSIS — D509 Iron deficiency anemia, unspecified: Secondary | ICD-10-CM

## 2018-03-09 ENCOUNTER — Ambulatory Visit
Admission: RE | Admit: 2018-03-09 | Discharge: 2018-03-09 | Disposition: A | Discharge status: Home / Self Care | Payer: Medicaid Other | Source: Ambulatory Visit | Attending: Urgent Care | Admitting: Urgent Care

## 2018-03-09 ENCOUNTER — Other Ambulatory Visit: Payer: Self-pay

## 2018-03-09 ENCOUNTER — Other Ambulatory Visit: Payer: Self-pay | Admitting: Hematology and Oncology

## 2018-03-09 ENCOUNTER — Inpatient Hospital Stay (HOSPITAL_BASED_OUTPATIENT_CLINIC_OR_DEPARTMENT_OTHER): Payer: Medicaid Other | Admitting: Hematology and Oncology

## 2018-03-09 ENCOUNTER — Ambulatory Visit
Admission: RE | Admit: 2018-03-09 | Discharge: 2018-03-09 | Disposition: A | Discharge status: Home / Self Care | Payer: Medicaid Other | Source: Ambulatory Visit | Attending: Hematology and Oncology | Admitting: Hematology and Oncology

## 2018-03-09 VITALS — BP 131/80 | HR 80 | Temp 97.6°F | Resp 18 | Wt 269.0 lb

## 2018-03-09 DIAGNOSIS — M545 Low back pain, unspecified: Secondary | ICD-10-CM

## 2018-03-09 DIAGNOSIS — M898X9 Other specified disorders of bone, unspecified site: Secondary | ICD-10-CM

## 2018-03-09 DIAGNOSIS — R748 Abnormal levels of other serum enzymes: Secondary | ICD-10-CM | POA: Diagnosis present

## 2018-03-09 DIAGNOSIS — D508 Other iron deficiency anemias: Secondary | ICD-10-CM | POA: Diagnosis not present

## 2018-03-09 DIAGNOSIS — D509 Iron deficiency anemia, unspecified: Secondary | ICD-10-CM | POA: Diagnosis not present

## 2018-03-09 DIAGNOSIS — N920 Excessive and frequent menstruation with regular cycle: Secondary | ICD-10-CM | POA: Diagnosis not present

## 2018-03-09 DIAGNOSIS — E538 Deficiency of other specified B group vitamins: Secondary | ICD-10-CM

## 2018-03-09 NOTE — Progress Notes (Signed)
Here for follow up   Per pt  " tired all the time ,feels sob ( walking from registration to waiting room makes her winded )  Pulse ox 97 % on RA.

## 2018-03-09 NOTE — Patient Instructions (Signed)
Iron Deficiency Anemia, Adult  Iron-deficiency anemia is when you have a low amount of red blood cells or hemoglobin. This happens because you have too little iron in your body. Hemoglobin carries oxygen to parts of the body. Anemia can cause your body to not get enough oxygen. It may or may not cause symptoms.  Follow these instructions at home:  Medicines  · Take over-the-counter and prescription medicines only as told by your doctor. This includes iron pills (supplements) and vitamins.  · If you cannot handle taking iron pills by mouth, ask your doctor about getting iron through:  ? A vein (intravenously).  ? A shot (injection) into a muscle.  · Take iron pills when your stomach is empty. If you cannot handle this, take them with food.  · Do not drink milk or take antacids at the same time as your iron pills.  · To prevent trouble pooping (constipation), eat fiber or take medicine (stool softener) as told by your doctor.  Eating and drinking    · Talk with your doctor before changing the foods you eat. He or she may tell you to eat foods that have a lot of iron, such as:  ? Liver.  ? Lowfat (lean) beef.  ? Breads and cereals that have iron added to them (fortified breads and cereals).  ? Eggs.  ? Dried fruit.  ? Dark green, leafy vegetables.  · Drink enough fluid to keep your pee (urine) clear or pale yellow.  · Eat fresh fruits and vegetables that are high in vitamin C. They help your body to use iron. Foods with a lot of vitamin C include:  ? Oranges.  ? Peppers.  ? Tomatoes.  ? Mangoes.  General instructions  · Return to your normal activities as told by your doctor. Ask your doctor what activities are safe for you.  · Keep yourself clean, and keep things clean around you (your surroundings). Anemia can make you get sick more easily.  · Keep all follow-up visits as told by your doctor. This is important.  Contact a doctor if:  · You feel sick to your stomach (nauseous).  · You throw up (vomit).  · You feel  weak.  · You are sweating for no clear reason.  · You have trouble pooping, such as:  ? Pooping (having a bowel movement) less than 3 times a week.  ? Straining to poop.  ? Having poop that is hard, dry, or larger than normal.  ? Feeling full or bloated.  ? Pain in the lower belly.  ? Not feeling better after pooping.  Get help right away if:  · You pass out (faint). If this happens, do not drive yourself to the hospital. Call your local emergency services (911 in the [place]  · You have chest pain.  · You have shortness of breath that:  ? Is very bad.  ? Gets worse with physical activity.  · You have a fast heartbeat.  · You get light-headed when getting up from sitting or lying down.  This information is not intended to replace advice given to you by your health care provider. Make sure you discuss any questions you have with your health care provider.  Document Released: 04/10/2010 Document Revised: 11/26/2015 Document Reviewed: 11/26/2015  Elsevier Interactive Patient Education © 2019 Elsevier Inc.

## 2018-03-09 NOTE — Progress Notes (Addendum)
[facility] day:  03/09/2018  Chief Complaint: Colleen Grimes is a 37 y.o. female with anemia who is seen for review of work-up and discussion regarding direction of therapy.  HPI:  The patient was last seen in the hematology clinic on 03/01/2018 for initial consultation.  She had a microcytic anemia c/w iron deficiency.  Diet was good.  She noted menorrhagia.  She had raw oatmeal pica.  She was fatigued.  She noted diffuse bone pain.  Alkaline phosphatase was elevated.  She underwent a work-up:  CBC revealed a hematocrit of 31.6, hemoglobin 8.9, MCV 68.8, platelets 433,000, WBC 5500 with an [place] of 3200.  Retic was 1.6%.  Ferritin was 11.  Iron saturation was 5% with a TIBC of 509.  Folate was 19.3.  B12 was 357.  Alakaline phosphatase was 200 (39-117) with 24% bone and 76% liver.  SPEP revealed no monoclonal protein.  Kappa free light chains were 32.2 (3.3-19.4), lambda free light chains 31.7 (5.7-26.3) with a ratio of 1.02 (0.26-1.65).  PT was 13.2 (INR 1.01).  PTT was 32.  Platelet function assay was normal.  Von Willebrand panel was normal (factor VIII 222%, ristocetin co-factor 128%, von willebrand antigen 149%).  Urinalysis revealed no hematuria or proteinuria.  We discussed Vitron C daily x 1 week, then increasing to BID. Prescription was sent to her pharmacy on 03/01/2018. Of note, she has not started taking the supplement as recommended citing that she "didn't know what she was supposed to be doing".    During the interim, patient continues to be fatigued. She experiences significant exertional dyspnea with minimal exertion or short distance ambulation. She continues to complain of diffuse pain in her bones. Pain has been persistent for the past year. Pain is worse in the lower back.   Patient denies abdominal pain. Patient denies that she has experienced any B symptoms. She denies any interval infections.  Patient advises that she maintains an  adequate appetite. She is eating well. Weight today is 269 lb (122 kg), which compared to her last visit to the clinic, represents a 1 pound increase.     Patient denies pain in the clinic today.   Past Medical History:  Diagnosis Date  . Acid reflux   . Anemia   . BRCA negative 03/2017   MyRisk neg  . Dyspnea    occasionally  . Epilepsy ([place])   . Family history of ovarian cancer 03/2017   MyRisk neg  . Hypothyroidism   . Seizures ([place])    no seizure in several years    Past Surgical History:  Procedure Laterality Date  . CESAREAN SECTION    . ESOPHAGOGASTRODUODENOSCOPY (EGD) WITH PROPOFOL N/A 01/02/2016   Procedure: ESOPHAGOGASTRODUODENOSCOPY (EGD) WITH PROPOFOL;  Surgeon: Lucilla Lame, MD;  Location: [facility];  Service: Endoscopy;  Laterality: N/A;  . TONSILLECTOMY    . TUBAL LIGATION      Family History  Problem Relation Age of Onset  . Ovarian cancer Mother   . Vaginal cancer Mother   . Cancer Mother   . Breast cancer Paternal Aunt   . Diabetes Paternal Grandmother   . Diabetes Paternal Grandfather   . Colon cancer Neg Hx   . Heart disease Neg Hx     Social History:  reports that she has quit smoking. She has never used smokeless tobacco. She reports that she does not drink alcohol or use drugs. Patient is employed in Press photographer at [employer].  Patient denies known exposures to radiation on toxins. The patient is alone today.  Allergies:  Allergies  Allergen Reactions  . Dilantin [Phenytoin Sodium Extended]     Current Medications: Current Outpatient Medications  Medication Sig Dispense Refill  . busPIRone (BUSPAR) 10 MG tablet Take 10 mg by mouth 2 (two) times daily.    Marland Kitchen escitalopram (LEXAPRO) 10 MG tablet Take by mouth.    . Ipratropium-Albuterol (COMBIVENT) 20-100 MCG/ACT AERS respimat Inhale 1 puff into the lungs 4 (four) times daily.    . Iron-Vitamin C 65-125 MG TABS 1 tablet daily x 1 week, then increase to 1 tablet twice a  day. 60 tablet 1  . omeprazole (PRILOSEC) 40 MG capsule Take by mouth.    Marland Kitchen albuterol (PROVENTIL HFA;VENTOLIN HFA) 108 (90 Base) MCG/ACT inhaler Inhale into the lungs.    . cyclobenzaprine (FLEXERIL) 10 MG tablet Take 10 mg by mouth 3 (three) times daily as needed for muscle spasms.     No current facility-administered medications for this visit.     Review of Systems:  GENERAL:  Feels"alright".  Tired all of the time.  No fevers.  Intermittent sweats.  Weight up 1 pound. PERFORMANCE STATUS (ECOG):  1 HEENT:  No visual changes, runny nose, sore throat, mouth sores or tenderness. Lungs:  Shortness of breath with exertion.  No cough.  No hemoptysis. Cardiac:  No chest pain, palpitations, orthopnea, or PND. GI:  No nausea, vomiting, diarrhea, constipation, melena or hematochezia. GU:  No urgency, frequency, dysuria, or hematuria.  Menses today. Musculoskeletal:  All bones hurt.  Lower back is "the worse".  Calcaneus hurts.  No muscle tenderness. Extremities:  Arms and legs hurt.  No swelling. Skin:  No rashes or skin changes. Neuro:  Headaches x 1 year.  No numbness or weakness, balance or coordination issues. Endocrine:  No diabetes, thyroid issues, hot flashes or night sweats. Psych:  No mood changes, depression or anxiety. Pain:  Diffuse bone pain. Review of systems:  All other systems reviewed and found to be negative.  Physical Exam: Blood pressure 131/80, pulse 80, temperature 97.6 F (36.4 C), temperature source Tympanic, resp. rate 18, weight 269 lb (122 kg). GENERAL:  Well developed, well nourished, woman sitting comfortably in the exam room in no acute distress. MENTAL STATUS:  Alert and oriented to person, place and time. HEAD:  Long brown hair.  Normocephalic, atraumatic, face symmetric, no Cushingoid features. EYES:  Blue eyes. No conjunctivitis or scleral icterus. NEUROLOGICAL: Unremarkable. PSYCH:  Appropriate.    No visits with results within 3 Day(s) from this visit.   Latest known visit with results is:  Office Visit on 03/01/2018  Component Date Value Ref Range Status  . Coagulation Factor VIII 03/01/2018 222* 56 - 140 % Final   Comment: (NOTE) FVIII activity can increase in a variety of clinical situations including normal pregnancy, in samples drawn from patients (particularly children) who are visibly stressed at the time of phlebotomy, as acute phase reactants, or in response to certain drug therapies such as DDAVP.  Persistently elevated FVIII activity is a risk factor for venous thrombosis as well as recurrence of venous thrombosis.  Risk is graded and increases with the degree of elevation.  Although elevated FVIII activity has been identified to cluster within families, a genetic basis for the elevation has not yet been elucidated (Br J Haematol. 2012; 683:419-622).   . Ristocetin Co-factor, Plasma 03/01/2018 128  50 - 200 % Final  Comment: (NOTE) Performed At: [facility] [address] [place], [place] [id number] Rush Farmer MD VQ:[id number]   . Von Willebrand Antigen, Plasma 03/01/2018 149  50 - 200 % Final   Comment: (NOTE) This test was developed and its performance characteristics determined by [facility]. It has not been cleared or approved by the Food and Drug Administration.   Marland Kitchen PFA Interpretation 03/01/2018          Final   Comment: Platelet function is normal. If patient history/physical examination give strong indication of a bleeding disorder repeat testing for confirmation.        Results of the test should always be interpreted in conjunction with the patient's medical history, clinical presentation and medication history. Patients with Hematocrit values <35.0% or Platelet counts <150,000/uL may result in values above the Laboratory established reference range.   . Collagen / Epinephrine 03/01/2018 120  0 - 193 seconds Final   Performed at [facility], [address] [place], [place] [address]  .  aPTT 03/01/2018 32  24 - 36 seconds Final   Performed at [facility], [address] [place], [place] [address]  . Prothrombin Time 03/01/2018 13.2  11.4 - 15.2 seconds Final  . INR 03/01/2018 1.01   Final   Performed at [facility], [address] [place], [place] [address]  . Color, Urine 03/01/2018 YELLOW  YELLOW Final  . APPearance 03/01/2018 CLEAR  CLEAR Final  . Specific Gravity, Urine 03/01/2018 1.020  1.005 - 1.030 Final  . pH 03/01/2018 5.5  5.0 - 8.0 Final  . Glucose, UA 03/01/2018 NEGATIVE  NEGATIVE mg/dL Final  . Hgb urine dipstick 03/01/2018 NEGATIVE  NEGATIVE Final  . Bilirubin Urine 03/01/2018 NEGATIVE  NEGATIVE Final  . Ketones, ur 03/01/2018 NEGATIVE  NEGATIVE mg/dL Final  . Protein, ur 03/01/2018 NEGATIVE  NEGATIVE mg/dL Final  . Nitrite 03/01/2018 NEGATIVE  NEGATIVE Final  . Leukocytes, UA 03/01/2018 NEGATIVE  NEGATIVE Final  . Squamous Epithelial / LPF 03/01/2018 0-5  0 - 5 Final  . WBC, UA 03/01/2018 0-5  0 - 5 WBC/hpf Final  . RBC / HPF 03/01/2018 NONE SEEN  0 - 5 RBC/hpf Final  . Bacteria, UA 03/01/2018 NONE SEEN  NONE SEEN Final   Performed at [facility], [address], [place], [place] [address]  . Blood Bank Specimen 03/01/2018 SAMPLE AVAILABLE FOR TESTING   Final  . Sample Expiration 03/01/2018    Final                   Value:03/04/2018 Performed at [facility], [address] [place],  [address]   . Kappa free light chain 03/01/2018 32.2* 3.3 - 19.4 mg/L Final  . Lamda free light chains 03/01/2018 31.7* 5.7 - 26.3 mg/L Final  . Kappa, lamda light chain ratio 03/01/2018 1.02  0.26 - 1.65 Final   Comment: (NOTE) Performed At: [facility] [address] [place], [place] [id number] Rush Farmer MD RK:[id number]   . Alk Phos 03/01/2018 200* 39 - 117 IU/L Final  . Alk Phos Bone Fract 03/01/2018 24  14 - 68 % Final  . Alk Phos Liver Fract 03/01/2018 76  18 - 85 % Final  . Intestinal %  03/01/2018 0  0 - 18 % Final   Comment: (NOTE) Performed At: [facility] [place], [place] [id number] Rush Farmer MD VP:[id number]   . Total Protein ELP 03/01/2018 6.8  6.0 - 8.5 g/dL Final  .  Albumin ELP 03/01/2018 3.5  2.9 - 4.4 g/dL Final  . Alpha-1-Globulin 03/01/2018 0.2  0.0 - 0.4 g/dL Final  . Alpha-2-Globulin 03/01/2018 0.7  0.4 - 1.0 g/dL Final  . Beta Globulin 03/01/2018 1.2  0.7 - 1.3 g/dL Final  . Gamma Globulin 03/01/2018 1.2  0.4 - 1.8 g/dL Final  . M-Spike, % 03/01/2018 Not Observed  Not Observed g/dL Final  . SPE Interp. 03/01/2018 Comment   Final   Comment: (NOTE) The SPE pattern appears essentially unremarkable. Evidence of monoclonal protein is not apparent. Performed At: [facility] [place], [place] [id number] Rush Farmer MD YK:[id number]   . Comment 03/01/2018 Comment   Final   Comment: (NOTE) Protein electrophoresis scan will follow via computer, mail, or courier delivery.   Marland Kitchen GLOBULIN, TOTAL 03/01/2018 3.3  2.2 - 3.9 g/dL Corrected  . A/G Ratio 03/01/2018 1.1  0.7 - 1.7 Corrected  . Folate 03/01/2018 19.3  >5.9 ng/mL Final   Performed at [facility], [address] [place], [place] [address]  . Vitamin B-12 03/01/2018 357  180 - 914 pg/mL Final   Comment: (NOTE) This assay is not validated for testing neonatal or myeloproliferative syndrome specimens for Vitamin B12 levels. Performed at [facility], [place] [address] [place], [place] [address]   . Iron 03/01/2018 26* 28 - 170 ug/dL Final  . TIBC 03/01/2018 509* 250 - 450 ug/dL Final  . Saturation Ratios 03/01/2018 5* 10.4 - 31.8 % Final  . UIBC 03/01/2018 483  ug/dL Final   Performed at [facility], [address], [place], [place] [address]  . Ferritin 03/01/2018 11  11 - 307 ng/mL Final   Performed at [facility], [address] [place], [place] [address]  . Retic Ct Pct 03/01/2018 1.6  0.4 - 3.1 % Final   . RBC. 03/01/2018 4.63  3.87 - 5.11 MIL/uL Final  . Retic Count, Absolute 03/01/2018 71.8  19.0 - 186.0 K/uL Final  . Immature Retic Fract 03/01/2018 16.9* 2.3 - 15.9 % Final   Performed at [facility], [address], [place], [place] [address]  . WBC 03/01/2018 5.5  4.0 - 10.5 K/uL Final  . RBC 03/01/2018 4.59  3.87 - 5.11 MIL/uL Final  . Hemoglobin 03/01/2018 8.9* 12.0 - 15.0 g/dL Final  . HCT 03/01/2018 31.6* 36.0 - 46.0 % Final  . MCV 03/01/2018 68.8* 80.0 - 100.0 fL Final  . MCH 03/01/2018 19.4* 26.0 - 34.0 pg Final  . MCHC 03/01/2018 28.2* 30.0 - 36.0 g/dL Final  . RDW 03/01/2018 18.2* 11.5 - 15.5 % Final  . Platelets 03/01/2018 433* 150 - 400 K/uL Final  . nRBC 03/01/2018 0.0  0.0 - 0.2 % Final  . Neutrophils Relative % 03/01/2018 57  % Final  . Neutro Abs 03/01/2018 3.2  1.7 - 7.7 K/uL Final  . Lymphocytes Relative 03/01/2018 32  % Final  . Lymphs Abs 03/01/2018 1.8  0.7 - 4.0 K/uL Final  . Monocytes Relative 03/01/2018 6  % Final  . Monocytes Absolute 03/01/2018 0.3  0.1 - 1.0 K/uL Final  . Eosinophils Relative 03/01/2018 4  % Final  . Eosinophils Absolute 03/01/2018 0.2  0.0 - 0.5 K/uL Final  . Basophils Relative 03/01/2018 1  % Final  . Basophils Absolute 03/01/2018 0.0  0.0 - 0.1 K/uL Final  . Immature Granulocytes 03/01/2018 0  % Final  . Abs Immature Granulocytes 03/01/2018 0.01  0.00 - 0.07 K/uL Final  Performed at [facility], [address], [place], [place] [address]  . Interpretation 03/01/2018 Note   Final   Comment: (NOTE) ------------------------------- COAGULATION: VON WILLEBRAND FACTOR ASSESSMENT CURRENT RESULTS ASSESSMENT The VWF:Ag is normal. The VWF:RCo is normal. The FVIII is elevated. VON WILLEBRAND FACTOR ASSESSMENT CURRENT RESULTS INTERPRETATION - These results are not consistent with a diagnosis of VWD according to the current NHLBI guideline. Persistently elevated FVIII activity is a risk factor for  venous thrombosis as well as recurrence of venous thrombosis. Risk is graded and increases with the degree of elevation. Although elevated FVIII activity has been identified to cluster within families, a genetic basis for the elevation has not yet been elucidated (Br J Haematol. 2012; 157(6):653-663). VON WILLEBRAND FACTOR ASSESSMENT - Results may be falsely elevated and possibly falsely normal as VWF and FVIII may increase in pregnancy, in samples drawn from patients (particularly children) who are visibly stressed at the time of phlebotomy, as acute phase reactants                          , or in response to certain drug therapies such as desmopressin. Repeat testing may be necessary before excluding a diagnosis of VWD especially if the clinical suspicion is high for an underlying bleeding disorder. The setting for phlebotomy should be as calm as possible and patients should be encouraged to sit quietly prior to the blood draw. VON WILLEBRAND FACTOR ASSESSMENT DEFINITIONS - VWD - von Willebrand disease; VWF - von Willebrand factor; VWF:Ag - VWF antigen; VWF:RCo - VWF ristocetin cofactor activity; FVIII - factor VIII activity. - MEDICAL DIRECTOR: For questions regarding panel interpretation, please contact Jake Bathe, M.D. at [facility] at [phone]. ------------------------------- DISCLAIMER These assessments and interpretations are provided as a convenience in support of the physician-patient relationship and are not intended to replace the physician's clinical judgment. They are derived from national guidelines in addit                          ion to other evidence and expert opinion. The clinician should consider this information within the context of clinical opinion and the individual patient. SEE GUIDANCE FOR VON WILLEBRAND FACTOR ASSESSMENT: (1) The National Heart, Lung and Blood Institute. The Diagnosis, Evaluation and Management of von  Willebrand Disease. Janeal Holmes, MD: [place] Publication 62-9476. 5465. Available at vSpecials.com.pt. (2) Daryl Eastern et al. [place] J Hematol. 2009; 84(6):366-370. (3) [place]. 2004;10(3):199-217. (4) Pasi KJ et al. Haemophilia. 2004; 10(3):218-231. Performed At: [facility] [place], [place] [id number] Thomasene Ripple MD EX:[id number]     Assessment:  Colleen Grimes is a 37 y.o. female with a microcytic anemia c/w iron deficiency.  Diet is good.  She notes menorrhagia.  She has raw oatmeal pica.  She has an elevated alkaline phosphatase, a low albumin, and a normal serum protein, calcium and creatinine.  Work-up on 03/01/2018 revealed a hematocrit of 31.6, hemoglobin 8.9, MCV 68.8, platelets 433,000, WBC 5500 with an [place] of 3200.  Retic was 1.6%.  Ferritin was 11.  Iron saturation was 5% with a TIBC of 509.  B12 was 357.  Alakaline phosphatase was 200 (39-117) with 24% bone and 76% liver.  Normal studies included:  folate, SPEP, free light chain ratio, and urinalysis.    She has menorrhagia.  Normal labs on 03/01/2018 revealed:  PT 13.2 (  INR 1.01),   PTT 32, platelet function assay, and von Willebrand panel (factor VIII 222%, ristocetin co-factor 128%, von willebrand antigen 149%).  Urinalysis revealed no hematuria or proteinuria.  She has family history of breast and ovarian cancer.  Symptomatically, she has diffuse bone pain (back > arms > legs).  Calcaneus pain.  She has intermittent sweats. Hemoglobin is 8.9.  Ferritin is 11.  Plan: 1.  Iron deficiency:  Review work-up.  Ferritin is 11 (low) c/w iron deficiency.  Etiology likely secondary to heavy menses.  Patient did not start oral iron (Vitron C).  Encourage starting oral iron.    Consider IV iron (Venofer) if anemia does not improve. 2.  Low normal B12:  Begin oral B12. 3.  Menorrhagia:  Work-up negative for underlying bleeding  diathesis. 4.  Elevated alkaline phosphatase:  Patient has diffuse bone.  Fractionated alkaline phosphatase was predominantly liver in origin.  Discuss plain films of lower back (worst pain) and bone scan. 5.  Plain films of lumbar sacral spine. 6.  Schedule bone scan. 7.  RTC after bone scan for MD assessment, review of imaging and discussion regarding direction of therapy.   Honor Loh, NP  03/09/2018, 1:46 PM   I saw and evaluated the patient, participating in the key portions of the service and reviewing pertinent diagnostic studies and records.  I reviewed the nurse practitioner's note and agree with the findings and the plan.  The assessment and plan were discussed with the patient. Multiple questions were asked by the patient and answered.   Nolon Stalls, MD 03/09/2018,1:46 PM

## 2018-03-18 DIAGNOSIS — R748 Abnormal levels of other serum enzymes: Secondary | ICD-10-CM | POA: Insufficient documentation

## 2018-03-21 ENCOUNTER — Encounter
Admission: RE | Admit: 2018-03-21 | Discharge: 2018-03-21 | Disposition: A | Discharge status: Home / Self Care | Payer: Medicaid Other | Source: Ambulatory Visit | Attending: Urgent Care | Admitting: Urgent Care

## 2018-03-21 DIAGNOSIS — R748 Abnormal levels of other serum enzymes: Secondary | ICD-10-CM | POA: Insufficient documentation

## 2018-03-21 DIAGNOSIS — M898X9 Other specified disorders of bone, unspecified site: Secondary | ICD-10-CM | POA: Insufficient documentation

## 2018-03-21 DIAGNOSIS — M545 Low back pain, unspecified: Secondary | ICD-10-CM

## 2018-03-21 MED ORDER — TECHNETIUM TC 99M MEDRONATE IV KIT
23.7000 | PACK | Freq: Once | INTRAVENOUS | Status: AC | PRN
  Administered 2018-03-21: 23.7 via INTRAVENOUS

## 2018-03-26 ENCOUNTER — Encounter: Payer: Self-pay | Admitting: Hematology and Oncology

## 2018-03-27 ENCOUNTER — Ambulatory Visit
Admission: RE | Admit: 2018-03-27 | Discharge: 2018-03-27 | Disposition: A | Discharge status: Home / Self Care | Payer: Medicaid Other | Source: Ambulatory Visit | Attending: Urgent Care | Admitting: Urgent Care

## 2018-03-27 ENCOUNTER — Inpatient Hospital Stay: Payer: Medicaid Other | Attending: Hematology and Oncology | Admitting: Hematology and Oncology

## 2018-03-27 ENCOUNTER — Other Ambulatory Visit: Payer: Self-pay | Admitting: Hematology and Oncology

## 2018-03-27 ENCOUNTER — Ambulatory Visit
Admission: RE | Admit: 2018-03-27 | Discharge: 2018-03-27 | Disposition: A | Discharge status: Home / Self Care | Payer: Medicaid Other | Attending: Urgent Care | Admitting: Urgent Care

## 2018-03-27 ENCOUNTER — Encounter: Payer: Self-pay | Admitting: Hematology and Oncology

## 2018-03-27 ENCOUNTER — Inpatient Hospital Stay: Payer: Medicaid Other

## 2018-03-27 VITALS — BP 130/85 | HR 77 | Temp 97.2°F | Resp 16 | Wt 266.9 lb

## 2018-03-27 DIAGNOSIS — R0600 Dyspnea, unspecified: Secondary | ICD-10-CM | POA: Diagnosis not present

## 2018-03-27 DIAGNOSIS — M79672 Pain in left foot: Secondary | ICD-10-CM

## 2018-03-27 DIAGNOSIS — N920 Excessive and frequent menstruation with regular cycle: Secondary | ICD-10-CM | POA: Diagnosis not present

## 2018-03-27 DIAGNOSIS — M898X9 Other specified disorders of bone, unspecified site: Secondary | ICD-10-CM | POA: Insufficient documentation

## 2018-03-27 DIAGNOSIS — R748 Abnormal levels of other serum enzymes: Secondary | ICD-10-CM

## 2018-03-27 DIAGNOSIS — E538 Deficiency of other specified B group vitamins: Secondary | ICD-10-CM | POA: Insufficient documentation

## 2018-03-27 DIAGNOSIS — G40909 Epilepsy, unspecified, not intractable, without status epilepticus: Secondary | ICD-10-CM | POA: Insufficient documentation

## 2018-03-27 DIAGNOSIS — D509 Iron deficiency anemia, unspecified: Secondary | ICD-10-CM

## 2018-03-27 DIAGNOSIS — M797 Fibromyalgia: Secondary | ICD-10-CM | POA: Diagnosis not present

## 2018-03-27 DIAGNOSIS — Z803 Family history of malignant neoplasm of breast: Secondary | ICD-10-CM | POA: Insufficient documentation

## 2018-03-27 DIAGNOSIS — R5383 Other fatigue: Secondary | ICD-10-CM | POA: Diagnosis not present

## 2018-03-27 DIAGNOSIS — Z79899 Other long term (current) drug therapy: Secondary | ICD-10-CM | POA: Diagnosis not present

## 2018-03-27 DIAGNOSIS — M79671 Pain in right foot: Secondary | ICD-10-CM | POA: Diagnosis present

## 2018-03-27 DIAGNOSIS — M2550 Pain in unspecified joint: Secondary | ICD-10-CM | POA: Insufficient documentation

## 2018-03-27 DIAGNOSIS — R948 Abnormal results of function studies of other organs and systems: Secondary | ICD-10-CM

## 2018-03-27 DIAGNOSIS — Z87891 Personal history of nicotine dependence: Secondary | ICD-10-CM

## 2018-03-27 DIAGNOSIS — K219 Gastro-esophageal reflux disease without esophagitis: Secondary | ICD-10-CM | POA: Diagnosis not present

## 2018-03-27 DIAGNOSIS — Z8041 Family history of malignant neoplasm of ovary: Secondary | ICD-10-CM | POA: Diagnosis not present

## 2018-03-27 DIAGNOSIS — D538 Other specified nutritional anemias: Secondary | ICD-10-CM

## 2018-03-27 DIAGNOSIS — E039 Hypothyroidism, unspecified: Secondary | ICD-10-CM | POA: Insufficient documentation

## 2018-03-27 LAB — HEPATIC FUNCTION PANEL
ALT: 22 U/L (ref 0–44)
AST: 21 U/L (ref 15–41)
Albumin: 3.7 g/dL (ref 3.5–5.0)
Alkaline Phosphatase: 165 U/L — ABNORMAL HIGH (ref 38–126)
Bilirubin, Direct: <0.1 mg/dL (ref 0.0–0.2)
Total Bilirubin: 0.7 mg/dL (ref 0.3–1.2)
Total Protein: 7.7 g/dL (ref 6.5–8.1)

## 2018-03-27 LAB — CBC WITH DIFFERENTIAL/PLATELET
Abs Immature Granulocytes: 0.02 10*3/uL (ref 0.00–0.07)
Basophils Absolute: 0.1 10*3/uL (ref 0.0–0.1)
Basophils Relative: 1 %
Eosinophils Absolute: 0.2 10*3/uL (ref 0.0–0.5)
Eosinophils Relative: 3 %
HCT: 34.1 % — ABNORMAL LOW (ref 36.0–46.0)
Hemoglobin: 9.7 g/dL — ABNORMAL LOW (ref 12.0–15.0)
Immature Granulocytes: 0 %
Lymphocytes Relative: 27 %
Lymphs Abs: 1.8 10*3/uL (ref 0.7–4.0)
MCH: 20.6 pg — ABNORMAL LOW (ref 26.0–34.0)
MCHC: 28.4 g/dL — ABNORMAL LOW (ref 30.0–36.0)
MCV: 72.4 fL — ABNORMAL LOW (ref 80.0–100.0)
Monocytes Absolute: 0.4 10*3/uL (ref 0.1–1.0)
Monocytes Relative: 6 %
Neutro Abs: 4.3 10*3/uL (ref 1.7–7.7)
Neutrophils Relative %: 63 %
Platelets: 417 10*3/uL — ABNORMAL HIGH (ref 150–400)
RBC: 4.71 MIL/uL (ref 3.87–5.11)
RDW: 22.2 % — ABNORMAL HIGH (ref 11.5–15.5)
WBC: 6.8 10*3/uL (ref 4.0–10.5)
nRBC: 0 % (ref 0.0–0.2)

## 2018-03-27 NOTE — Progress Notes (Signed)
Pt. Here for f/u. Denies any complaints at this time.

## 2018-03-27 NOTE — Progress Notes (Signed)
[facility] day:  03/27/2018  Chief Complaint: Colleen Grimes is a 37 y.o. female with iron deficiency anemia who is seen for review of bone scan and discussion regarding direction of therapy.  HPI:  The patient was last seen in the hematology clinic on 03/09/2018.  Work-up revealed iron deficiency, a low normal B12, and an elevated alkaline phosphatase (predominantly liver fraction).  We discussed oral iron and B12.    Symptomatically,  she had diffuse bone pain (back > arms > legs).  She described calcaneus pain.  She had intermittent sweats. Hemoglobin was 8.9.  Ferritin was 11.  Lumbar and sacral plain films on 03/09/2018 revealed mild degenerative changes at L1-L4.  Bone scan on 03/21/2018 revealed multiple areas of abnormal activity in both feet and both ankles. Recommend correlation with plain radiographs for further evaluation.  During the interim, patient continues to experience significant fatigue. She denies an obvious GI related blood loss, however menorrhagia persists. She continues to have diffuse bone pain. She states, "I am tired of going to all of these doctors and not finding out anything. I am tired of pain. I can't event work a 4-5 hour shift without hurting". Patient tearful in clinic today. Patient denies that she has experienced any B symptoms. She denies any interval infections.   Patient advises that she maintains an adequate appetite. She is eating well. Weight today is 266 lb 13.9 oz (121.1 kg), which compared to her last visit to the clinic, represents a 3 pound decrease.     Patient complains of pain rated 4/10 in the clinic today.   Past Medical History:  Diagnosis Date  . Acid reflux   . Anemia   . BRCA negative 03/2017   MyRisk neg  . Dyspnea    occasionally  . Epilepsy ([place])   . Family history of ovarian cancer 03/2017   MyRisk neg  . Hypothyroidism   . Seizures ([place])    no seizure in several years    Past  Surgical History:  Procedure Laterality Date  . CESAREAN SECTION    . ESOPHAGOGASTRODUODENOSCOPY (EGD) WITH PROPOFOL N/A 01/02/2016   Procedure: ESOPHAGOGASTRODUODENOSCOPY (EGD) WITH PROPOFOL;  Surgeon: Lucilla Lame, MD;  Location: [facility];  Service: Endoscopy;  Laterality: N/A;  . TONSILLECTOMY    . TUBAL LIGATION      Family History  Problem Relation Age of Onset  . Ovarian cancer Mother   . Vaginal cancer Mother   . Cancer Mother   . Breast cancer Paternal Aunt   . Diabetes Paternal Grandmother   . Diabetes Paternal Grandfather   . Colon cancer Neg Hx   . Heart disease Neg Hx     Social History:  reports that she has quit smoking. She has never used smokeless tobacco. She reports that she does not drink alcohol or use drugs. 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  Patient denies known exposures to radiation on toxins. The patient is alone today.  Allergies:  Allergies  Allergen Reactions  . Dilantin [Phenytoin Sodium Extended]     Current Medications: Current Outpatient Medications  Medication Sig Dispense Refill  . albuterol (PROVENTIL HFA;VENTOLIN HFA) 108 (90 Base) MCG/ACT inhaler Inhale into the lungs.    . busPIRone (BUSPAR) 10 MG tablet Take 10 mg by mouth 2 (two) times daily.    . cyclobenzaprine (FLEXERIL) 10 MG tablet Take 10 mg by mouth 3 (three) times daily as needed  for muscle spasms.    Marland Kitchen escitalopram (LEXAPRO) 10 MG tablet Take by mouth.    . Ipratropium-Albuterol (COMBIVENT) 20-100 MCG/ACT AERS respimat Inhale 1 puff into the lungs 4 (four) times daily.    . Iron-Vitamin C 65-125 MG TABS 1 tablet daily x 1 week, then increase to 1 tablet twice a day. 60 tablet 1  . omeprazole (PRILOSEC) 40 MG capsule Take by mouth.     No current facility-administered medications for this visit.     Review of Systems:  GENERAL:  Feels "alright".  Fatigue.  No fevers, sweats . Weight down 3 pounds. PERFORMANCE STATUS (ECOG):  1 HEENT:   No visual changes, runny nose, sore throat, mouth sores or tenderness. Lungs: No shortness of breath or cough.  No hemoptysis. Cardiac:  No chest pain, palpitations, orthopnea, or PND. GI:  No nausea, vomiting, diarrhea, constipation, melena or hematochezia. GU:  No urgency, frequency, dysuria, or hematuria. Musculoskeletal:  Bones hurt.  No muscle tenderness. Extremities:  No pain or swelling. Skin:  No rashes or skin changes. Neuro:  Mentally exhausted.  No headache, numbness or weakness, balance or coordination issues. Endocrine:  No diabetes, thyroid issues, hot flashes or night sweats. Psych:  No mood changes, depression or anxiety. Pain:  Diffuse bone pain. Review of systems:  All other systems reviewed and found to be negative.   Physical Exam: Blood pressure 130/85, pulse 77, temperature (!) 97.2 F (36.2 C), temperature source Tympanic, resp. rate 16, weight 266 lb 13.9 oz (121.1 kg), last menstrual period 03/09/2018, SpO2 100 %. GENERAL:  Well developed, well nourished, woman sitting comfortably in the exam room in no acute distress. MENTAL STATUS:  Alert and oriented to person, place and time. HEAD:  Long brown hair.  Normocephalic, atraumatic, face symmetric, no Cushingoid features. EYES:  Blue eyes.  No conjunctivitis or scleral icterus. ENT:  Oropharynx clear without lesion.  Tongue normal. Mucous membranes moist.  NEUROLOGICAL: Unremarkable. PSYCH:  Appropriate.    No visits with results within 3 Day(s) from this visit.  Latest known visit with results is:  Office Visit on 03/01/2018  Component Date Value Ref Range Status  . Coagulation Factor VIII 03/01/2018 222* 56 - 140 % Final   Comment: (NOTE) FVIII activity can increase in a variety of clinical situations including normal pregnancy, in samples drawn from patients (particularly children) who are visibly stressed at the time of phlebotomy, as acute phase reactants, or in response to certain drug therapies such  as DDAVP.  Persistently elevated FVIII activity is a risk factor for venous thrombosis as well as recurrence of venous thrombosis.  Risk is graded and increases with the degree of elevation.  Although elevated FVIII activity has been identified to cluster within families, a genetic basis for the elevation has not yet been elucidated (Br J Haematol. 2012; 132:440-102).   . Ristocetin Co-factor, Plasma 03/01/2018 128  50 - 200 % Final   Comment: (NOTE) Performed At: [facility] [place], [place] [id number] Rush Farmer MD [id number]   . Von Willebrand Antigen, Plasma 03/01/2018 149  50 - 200 % Final   Comment: (NOTE) This test was developed and its performance characteristics determined by [facility]. It has not been cleared or approved by the Food and Drug Administration.   Marland Kitchen PFA Interpretation 03/01/2018          Final   Comment: Platelet function is normal. If patient history/physical examination give strong indication of a bleeding disorder repeat testing for  confirmation.        Results of the test should always be interpreted in conjunction with the patient's medical history, clinical presentation and medication history. Patients with Hematocrit values <35.0% or Platelet counts <150,000/uL may result in values above the Laboratory established reference range.   . Collagen / Epinephrine 03/01/2018 120  0 - 193 seconds Final   Performed at [facility], [address], [place], [place] [address]  . aPTT 03/01/2018 32  24 - 36 seconds Final   Performed at [facility], [address] [place], [place] [address]  . Prothrombin Time 03/01/2018 13.2  11.4 - 15.2 seconds Final  . INR 03/01/2018 1.01   Final   Performed at [facility], [address] [place], [place] [address]  . Color, Urine 03/01/2018 YELLOW  YELLOW Final  . APPearance 03/01/2018 CLEAR  CLEAR Final  . Specific Gravity, Urine 03/01/2018 1.020  1.005 - 1.030 Final   . pH 03/01/2018 5.5  5.0 - 8.0 Final  . Glucose, UA 03/01/2018 NEGATIVE  NEGATIVE mg/dL Final  . Hgb urine dipstick 03/01/2018 NEGATIVE  NEGATIVE Final  . Bilirubin Urine 03/01/2018 NEGATIVE  NEGATIVE Final  . Ketones, ur 03/01/2018 NEGATIVE  NEGATIVE mg/dL Final  . Protein, ur 03/01/2018 NEGATIVE  NEGATIVE mg/dL Final  . Nitrite 03/01/2018 NEGATIVE  NEGATIVE Final  . Leukocytes, UA 03/01/2018 NEGATIVE  NEGATIVE Final  . Squamous Epithelial / LPF 03/01/2018 0-5  0 - 5 Final  . WBC, UA 03/01/2018 0-5  0 - 5 WBC/hpf Final  . RBC / HPF 03/01/2018 NONE SEEN  0 - 5 RBC/hpf Final  . Bacteria, UA 03/01/2018 NONE SEEN  NONE SEEN Final   Performed at [facility], [address] [place],  [address]  . Blood Bank Specimen 03/01/2018 SAMPLE AVAILABLE FOR TESTING   Final  . Sample Expiration 03/01/2018    Final                   Value:03/04/2018 Performed at [facility], [address], [place],  [address]   . Kappa free light chain 03/01/2018 32.2* 3.3 - 19.4 mg/L Final  . Lamda free light chains 03/01/2018 31.7* 5.7 - 26.3 mg/L Final  . Kappa, lamda light chain ratio 03/01/2018 1.02  0.26 - 1.65 Final   Comment: (NOTE) Performed At: [facility] [address] [place], [place] [id number] Rush Farmer MD [id number]   . Alk Phos 03/01/2018 200* 39 - 117 IU/L Final  . Alk Phos Bone Fract 03/01/2018 24  14 - 68 % Final  . Alk Phos Liver Fract 03/01/2018 76  18 - 85 % Final  . Intestinal % 03/01/2018 0  0 - 18 % Final   Comment: (NOTE) Performed At: [facility] [place], [place] [id number] Rush Farmer MD QA:[id number]   . Total Protein ELP 03/01/2018 6.8  6.0 - 8.5 g/dL Final  . Albumin ELP 03/01/2018 3.5  2.9 - 4.4 g/dL Final  . Alpha-1-Globulin 03/01/2018 0.2  0.0 - 0.4 g/dL Final  . Alpha-2-Globulin 03/01/2018 0.7  0.4 - 1.0 g/dL Final  . Beta Globulin 03/01/2018 1.2  0.7 - 1.3 g/dL Final  . Gamma Globulin  03/01/2018 1.2  0.4 - 1.8 g/dL Final  . M-Spike, % 03/01/2018 Not Observed  Not Observed g/dL Final  . SPE Interp. 03/01/2018 Comment   Final   Comment: (NOTE) The SPE pattern appears essentially unremarkable. Evidence of monoclonal protein is not apparent. Performed  At: [facility] [place], [place] [id number] Rush Farmer MD [id number]   . Comment 03/01/2018 Comment   Final   Comment: (NOTE) Protein electrophoresis scan will follow via computer, mail, or courier delivery.   Marland Kitchen GLOBULIN, TOTAL 03/01/2018 3.3  2.2 - 3.9 g/dL Corrected  . A/G Ratio 03/01/2018 1.1  0.7 - 1.7 Corrected  . Folate 03/01/2018 19.3  >5.9 ng/mL Final   Performed at [facility], [address] [place], [place] [address]  . Vitamin B-12 03/01/2018 357  180 - 914 pg/mL Final   Comment: (NOTE) This assay is not validated for testing neonatal or myeloproliferative syndrome specimens for Vitamin B12 levels. Performed at [facility], [place] [address], [place], [place] [address]   . Iron 03/01/2018 26* 28 - 170 ug/dL Final  . TIBC 03/01/2018 509* 250 - 450 ug/dL Final  . Saturation Ratios 03/01/2018 5* 10.4 - 31.8 % Final  . UIBC 03/01/2018 483  ug/dL Final   Performed at [facility], [address] [place], [place] [address]  . Ferritin 03/01/2018 11  11 - 307 ng/mL Final   Performed at [facility], [address] [place], [place] [address]  . Retic Ct Pct 03/01/2018 1.6  0.4 - 3.1 % Final  . RBC. 03/01/2018 4.63  3.87 - 5.11 MIL/uL Final  . Retic Count, Absolute 03/01/2018 71.8  19.0 - 186.0 K/uL Final  . Immature Retic Fract 03/01/2018 16.9* 2.3 - 15.9 % Final   Performed at [facility], [address] [place], [place] [address]  . WBC 03/01/2018 5.5  4.0 - 10.5 K/uL Final  . RBC 03/01/2018 4.59  3.87 - 5.11 MIL/uL Final  . Hemoglobin 03/01/2018 8.9* 12.0 - 15.0 g/dL Final  . HCT 03/01/2018 31.6* 36.0 - 46.0 % Final  . MCV  03/01/2018 68.8* 80.0 - 100.0 fL Final  . MCH 03/01/2018 19.4* 26.0 - 34.0 pg Final  . MCHC 03/01/2018 28.2* 30.0 - 36.0 g/dL Final  . RDW 03/01/2018 18.2* 11.5 - 15.5 % Final  . Platelets 03/01/2018 433* 150 - 400 K/uL Final  . nRBC 03/01/2018 0.0  0.0 - 0.2 % Final  . Neutrophils Relative % 03/01/2018 57  % Final  . Neutro Abs 03/01/2018 3.2  1.7 - 7.7 K/uL Final  . Lymphocytes Relative 03/01/2018 32  % Final  . Lymphs Abs 03/01/2018 1.8  0.7 - 4.0 K/uL Final  . Monocytes Relative 03/01/2018 6  % Final  . Monocytes Absolute 03/01/2018 0.3  0.1 - 1.0 K/uL Final  . Eosinophils Relative 03/01/2018 4  % Final  . Eosinophils Absolute 03/01/2018 0.2  0.0 - 0.5 K/uL Final  . Basophils Relative 03/01/2018 1  % Final  . Basophils Absolute 03/01/2018 0.0  0.0 - 0.1 K/uL Final  . Immature Granulocytes 03/01/2018 0  % Final  . Abs Immature Granulocytes 03/01/2018 0.01  0.00 - 0.07 K/uL Final   Performed at [facility], [address] [place], [place] [address]  . Interpretation 03/01/2018 Note   Final   Comment: (NOTE) ------------------------------- COAGULATION: VON WILLEBRAND FACTOR ASSESSMENT CURRENT RESULTS ASSESSMENT The VWF:Ag is normal. The VWF:RCo is normal. The FVIII is elevated. VON WILLEBRAND FACTOR ASSESSMENT CURRENT RESULTS INTERPRETATION - These results are not consistent with a diagnosis of VWD according to the current NHLBI guideline. Persistently elevated FVIII activity is a risk factor for venous thrombosis as well as recurrence of venous thrombosis. Risk is graded and increases with  the degree of elevation. Although elevated FVIII activity has been identified to cluster within families, a genetic basis for the elevation has not yet been elucidated (Br J Haematol. 2012; 157(6):653-663). VON WILLEBRAND FACTOR ASSESSMENT - Results may be falsely elevated and possibly falsely normal as VWF and FVIII may increase in pregnancy, in samples drawn from  patients (particularly children) who are visibly stressed at the time of phlebotomy, as acute phase reactants                          , or in response to certain drug therapies such as desmopressin. Repeat testing may be necessary before excluding a diagnosis of VWD especially if the clinical suspicion is high for an underlying bleeding disorder. The setting for phlebotomy should be as calm as possible and patients should be encouraged to sit quietly prior to the blood draw. VON WILLEBRAND FACTOR ASSESSMENT DEFINITIONS - VWD - von Willebrand disease; VWF - von Willebrand factor; VWF:Ag - VWF antigen; VWF:RCo - VWF ristocetin cofactor activity; FVIII - factor VIII activity. - MEDICAL DIRECTOR: For questions regarding panel interpretation, please contact Jake Bathe, M.D. at [facility] at [phone]. ------------------------------- DISCLAIMER These assessments and interpretations are provided as a convenience in support of the physician-patient relationship and are not intended to replace the physician's clinical judgment. They are derived from national guidelines in addit                          ion to other evidence and expert opinion. The clinician should consider this information within the context of clinical opinion and the individual patient. SEE GUIDANCE FOR VON WILLEBRAND FACTOR ASSESSMENT: (1) The National Heart, Lung and Blood Institute. The Diagnosis, Evaluation and Management of von Willebrand Disease. Janeal Holmes, MD: [place] Publication 12-9321. 5573. Available at vSpecials.com.pt. (2) Daryl Eastern et al. [place] J Hematol. 2009; 84(6):366-370. (3) [place]. 2004;10(3):199-217. (4) Pasi KJ et al. Haemophilia. 2004; 10(3):218-231. Performed At: [facility] [place], [place] [id number] Thomasene Ripple MD [id number]     Assessment:  Colleen Grimes is  a 38 y.o. female with a microcytic anemia c/w iron deficiency.  Diet is good.  She notes menorrhagia.  She has raw oatmeal pica.  She has an elevated alkaline phosphatase, a low albumin, and a normal serum protein, calcium and creatinine.  Work-up on 03/01/2018 revealed a hematocrit of 31.6, hemoglobin 8.9, MCV 68.8, platelets 433,000, WBC 5500 with an [place] of 3200.  Retic was 1.6%.  Ferritin was 11.  Iron saturation was 5% with a TIBC of 509.  B12 was 357.  Alakaline phosphatase was 200 (39-117) with 24% bone and 76% liver.  Normal studies included:  folate, SPEP, free light chain ratio, and urinalysis.    Lumbar and sacral plain films on 03/09/2018 revealed mild degenerative changes at L1-L4.  Bone scan on 03/21/2018 revealed multiple areas of abnormal activity in both feet and both ankles.  She has menorrhagia.  Normal labs on 03/01/2018 revealed:  PT 13.2 (INR 1.01),   PTT 32, platelet function assay, and von Willebrand panel (factor VIII 222%, ristocetin co-factor 128%, von willebrand antigen 149%).  Urinalysis revealed no hematuria or proteinuria.  She has family history of breast and ovarian cancer.  Symptomatically, she continues to have diffuse bone pain,  She is "mentally exhausted".  Hemoglobin has improved from 8.9 to 9.7.  Alkaline phosphatase is 165.  Plan: 1.  Labs today:  CBC with diff, LFTs, ANA. 2.  Iron deficiency:  Hemoglobin has improved from 8.9 to 9.7.  Ferritin was 11 (low) on 03/01/2018.  Etiology felt secondary to heavy menses.  Continue Vitron C.  Consider IV iron (Venofer) if does not continue to improve. 3.  Low normal B12:  Continue oral B12. 4.  Menorrhagia:  Work-up negative for underlying bleeding diathesis. 5.  Elevated alkaline phosphatase:  Patient has diffuse bone.  Fractionated alkaline phosphatase was predominantly liver in origin.  Alkaline phosphatase has decreased from 200 to 165.  Bone scan revealed multiple areas of abnormal activity in both  feet and both ankles.  Lumbar and sacral plain films revealed mild degenerative changes at L1-L4.  Plain films of feet today.  Review imaging with orthopedics (etiology unclear) today. 6.  RTC in 2 weeks for MD assessment.   Addendum:  Patient previously seen by Dr Marlowe Sax, rheumatologist.  Notes reviewed.  She was initially seen on 12/15/2015 for joint pain, myalgia, and chronic fatigue.  Symptoms had been present for 1 year.  She describes "bones hurt".  Pain exacerbated by pressure over muscle or joints.  No synovitis.  Pain reproducible by pressure. Laboratory work-up negative.  She was diagnosed with fibromyalgia.  Last appointment on 03/08/2017.  She was seen for recurrent left elbow lateral epicondyle bursitis.   Honor Loh, NP  03/27/2018, 4:00 PM   I saw and evaluated the patient, participating in the key portions of the service and reviewing pertinent diagnostic studies and records.  I reviewed the nurse practitioner's note and agree with the findings and the plan.  The assessment and plan were discussed with the patient. Multiple questions were asked by the patient and answered.   Nolon Stalls, MD 03/27/2018,4:00 PM

## 2018-03-28 LAB — ANA W/REFLEX: Anti Nuclear Antibody(ANA): NEGATIVE

## 2018-03-29 ENCOUNTER — Encounter: Payer: Self-pay | Admitting: Urgent Care

## 2018-03-29 NOTE — Telephone Encounter (Signed)
This encounter was created in error - please disregard.

## 2018-03-30 ENCOUNTER — Telehealth: Payer: Self-pay

## 2018-03-30 NOTE — Telephone Encounter (Signed)
Contacted patient to advise of results of scans and lab work. Pt. States she will reach out to Dr. Meda Coffee (Rheumatology) as directed to schedule f/u appt. Reminded patient of appt. On 04/10/2018 and that results would be discussed in more detail at this time. Pt states understanding and denies any further questions.

## 2018-03-30 NOTE — Telephone Encounter (Signed)
-----   Message from Karen Kitchens, NP sent at 03/29/2018  4:53 PM EST ----- Plain films of feet normal. ANA was negative. Alkaline phosphatase still high. We have reached out to orthopedics, however they think that patient needs to be seen for further evaluation by rheumatology.   She needs to contact [facility] rheumatology (Dr. Francesca Oman) office for a follow up appointment. We will discuss further at her next RTC appointment.   Gaspar Bidding

## 2018-03-31 ENCOUNTER — Ambulatory Visit: Payer: Medicaid Other | Admitting: Hematology and Oncology

## 2018-04-06 ENCOUNTER — Telehealth: Payer: Self-pay

## 2018-04-06 NOTE — Telephone Encounter (Signed)
Contacted patient to remind of appt. In [place] on Monday, 04/10/2018.

## 2018-04-09 NOTE — Progress Notes (Signed)
[facility] day:  04/10/2018  Chief Complaint: Colleen Grimes is a 38 y.o. female with iron deficiency anemia, diffuse pain felt secondary to fibromyalgia, abnormal bone scan (feet), and an elevated alkaline phosphatase (liver fraction) who is seen for 2 week assessment.  HPI:  The patient was last seen in the hematology clinic on 03/27/2018. At that time, she continued to have diffuse bone pain.  She was "mentally exhausted".  Hemoglobin had improved from 8.9 to 9.7.  Alkaline phosphatase was 165 (improved).  Bone scan revealed multiple areas of abnormal activity in both feet and both ankles.  Lumbar and sacral plain films revealed mild degenerative changes at L1-L4.  Follow-up plain films of the feet revealed no acute or focal abnormality.  There was no significant arthropathy.  There were no bony lesions to account for the abnormal bone scan.  Orthopedics Rudene Christians, MD) was consulted and imaging reviewed. Recommendations were to send patient back to rheumatology for further evaluation and on-going care. Of note, patient has been seen in the past by Dr. Annalee Genta.   Patient was seen in consult on 04/07/2018 by rheumatology Annalee Genta, MD). Notes reviewed. Additional lab studies were ordered and patient referred to endocrinology for further elevation of her elevated alkaline phosphatase level.   During the interim, patient feels "the same". She denies any improvement in her bone pain.  Patient has been seen by rheumatology and notes referred to endocrinology. Patient states, "she did not know what was going on either. I feel like I am being passed around, but no one is giving me answers".  Patient denies that she has experienced any B symptoms. She denies any interval infections.   Patient advises that she maintains an adequate appetite. She is eating well. Weight today is 270 lb 1 oz (122.5 kg), which compared to her last visit to the clinic, represents  a 4 pound increase.   Patient complains of pain rated 4/10 in the clinic today.   Past Medical History:  Diagnosis Date  . Acid reflux   . Anemia   . BRCA negative 03/2017   MyRisk neg  . Dyspnea    occasionally  . Epilepsy ([place])   . Family history of ovarian cancer 03/2017   MyRisk neg  . Hypothyroidism   . Seizures ([place])    no seizure in several years    Past Surgical History:  Procedure Laterality Date  . CESAREAN SECTION    . ESOPHAGOGASTRODUODENOSCOPY (EGD) WITH PROPOFOL N/A 01/02/2016   Procedure: ESOPHAGOGASTRODUODENOSCOPY (EGD) WITH PROPOFOL;  Surgeon: Lucilla Lame, MD;  Location: [facility];  Service: Endoscopy;  Laterality: N/A;  . TONSILLECTOMY    . TUBAL LIGATION      Family History  Problem Relation Age of Onset  . Ovarian cancer Mother   . Vaginal cancer Mother   . Cancer Mother   . Breast cancer Paternal Aunt   . Diabetes Paternal Grandmother   . Diabetes Paternal Grandfather   . Colon cancer Neg Hx   . Heart disease Neg Hx     Social History:  reports that she has quit smoking. She has never used smokeless tobacco. She reports that she does not drink alcohol or use drugs. Patient is employed in Press photographer at [employer].  Patient denies known exposures to radiation on toxins. The patient is alone today.  Allergies:  Allergies  Allergen Reactions  . Dilantin [Phenytoin Sodium Extended]     Current Medications:  Current Outpatient Medications  Medication Sig Dispense Refill  . busPIRone (BUSPAR) 10 MG tablet Take 10 mg by mouth 2 (two) times daily.    Marland Kitchen escitalopram (LEXAPRO) 10 MG tablet Take by mouth.    . Ipratropium-Albuterol (COMBIVENT) 20-100 MCG/ACT AERS respimat Inhale 1 puff into the lungs 4 (four) times daily.    . Iron-Vitamin C 65-125 MG TABS 1 tablet daily x 1 week, then increase to 1 tablet twice a day. 60 tablet 1  . omeprazole (PRILOSEC) 40 MG capsule Take by mouth.    Marland Kitchen albuterol (PROVENTIL HFA;VENTOLIN HFA)  108 (90 Base) MCG/ACT inhaler Inhale into the lungs.    . cyclobenzaprine (FLEXERIL) 10 MG tablet Take 10 mg by mouth 3 (three) times daily as needed for muscle spasms.     No current facility-administered medications for this visit.     Review of Systems:  GENERAL:  Feels "the same".  No fevers, sweats or weight loss.  Weight up 4 pounds. PERFORMANCE STATUS (ECOG):  1 HEENT:  No visual changes, runny nose, sore throat, mouth sores or tenderness. Lungs: No shortness of breath or cough.  No hemoptysis. Cardiac:  No chest pain, palpitations, orthopnea, or PND. GI:  No nausea, vomiting, diarrhea, constipation, melena or hematochezia. GU:  No urgency, frequency, dysuria, or hematuria. Musculoskeletal:  "Same bone pain". No muscle tenderness. Extremities:  No pain or swelling. Skin:  No rashes or skin changes. Neuro:  No headache, numbness or weakness, balance or coordination issues. Endocrine:  No diabetes, thyroid issues, hot flashes or night sweats. Psych:  No mood changes, depression or anxiety. Pain: Diffuse bone pain. Review of systems:  All other systems reviewed and found to be negative.   Physical Exam: Blood pressure 121/77, pulse 78, temperature 97.8 F (36.6 C), temperature source Tympanic, resp. rate 18, height 5' 6"  (1.676 m), weight 270 lb 1 oz (122.5 kg), SpO2 99 %. GENERAL:  Well developed, well nourished, woman sitting comfortably in the exam room in no acute distress. MENTAL STATUS:  Alert and oriented to person, place and time. HEAD:  Long brown hair.  Normocephalic, atraumatic, face symmetric, no Cushingoid features. EYES:  Blue eyes.  No conjunctivitis or scleral icterus. NEUROLOGICAL: Unremarkable. PSYCH:  Appropriate.     No visits with results within 3 Day(s) from this visit.  Latest known visit with results is:  Appointment on 03/27/2018  Component Date Value Ref Range Status  . Total Protein 03/27/2018 7.7  6.5 - 8.1 g/dL Final  . Albumin 03/27/2018 3.7   3.5 - 5.0 g/dL Final  . AST 03/27/2018 21  15 - 41 U/L Final  . ALT 03/27/2018 22  0 - 44 U/L Final  . Alkaline Phosphatase 03/27/2018 165* 38 - 126 U/L Final  . Total Bilirubin 03/27/2018 0.7  0.3 - 1.2 mg/dL Final  . Bilirubin, Direct 03/27/2018 <0.1  0.0 - 0.2 mg/dL Final  . Indirect Bilirubin 03/27/2018 NOT CALCULATED  0.3 - 0.9 mg/dL Final   Performed at [facility], [address] [place], [place] [address]  . Anti Nuclear Antibody(ANA) 03/27/2018 Negative  Negative Final   Comment: (NOTE) Performed At: [facility] [place], [place] [id number] Rush Farmer MD QH:[id number]   . WBC 03/27/2018 6.8  4.0 - 10.5 K/uL Final  . RBC 03/27/2018 4.71  3.87 - 5.11 MIL/uL Final  . Hemoglobin 03/27/2018 9.7* 12.0 - 15.0 g/dL Final  . HCT 03/27/2018 34.1* 36.0 - 46.0 % Final  . MCV 03/27/2018  72.4* 80.0 - 100.0 fL Final  . MCH 03/27/2018 20.6* 26.0 - 34.0 pg Final  . MCHC 03/27/2018 28.4* 30.0 - 36.0 g/dL Final  . RDW 03/27/2018 22.2* 11.5 - 15.5 % Final  . Platelets 03/27/2018 417* 150 - 400 K/uL Final  . nRBC 03/27/2018 0.0  0.0 - 0.2 % Final  . Neutrophils Relative % 03/27/2018 63  % Final  . Neutro Abs 03/27/2018 4.3  1.7 - 7.7 K/uL Final  . Lymphocytes Relative 03/27/2018 27  % Final  . Lymphs Abs 03/27/2018 1.8  0.7 - 4.0 K/uL Final  . Monocytes Relative 03/27/2018 6  % Final  . Monocytes Absolute 03/27/2018 0.4  0.1 - 1.0 K/uL Final  . Eosinophils Relative 03/27/2018 3  % Final  . Eosinophils Absolute 03/27/2018 0.2  0.0 - 0.5 K/uL Final  . Basophils Relative 03/27/2018 1  % Final  . Basophils Absolute 03/27/2018 0.1  0.0 - 0.1 K/uL Final  . Immature Granulocytes 03/27/2018 0  % Final  . Abs Immature Granulocytes 03/27/2018 0.02  0.00 - 0.07 K/uL Final   Performed at [facility], [address] [place], [place] [address]    Assessment:  LALONNIE SHAFFER is a 39 y.o. female with iron deficiency anemia.  Diet is good.  She notes  menorrhagia.  She has raw oatmeal pica.  She is on Vitron C.  She has an elevated alkaline phosphatase, a low albumin, and a normal serum protein, calcium and creatinine.  Work-up on 03/01/2018 revealed a hematocrit of 31.6, hemoglobin 8.9, MCV 68.8, platelets 433,000, WBC 5500 with an [place] of 3200.  Retic was 1.6%.  Ferritin was 11.  Iron saturation was 5% with a TIBC of 509.  B12 was 357.  Alakaline phosphatase was 200 (39-117) with 24% bone and 76% liver.  Normal studies included:  folate, SPEP, free light chain ratio, and urinalysis.    Lumbar and sacral plain films on 03/09/2018 revealed mild degenerative changes at L1-L4.  Bone scan on 03/21/2018 revealed multiple areas of abnormal activity in both feet and both ankles.  Plain films of the feet on 03/27/2018 revealed no acute or focal abnormality.  There was no significant arthropathy.  There were no bony lesions to account for the abnormal bone scan.  She has menorrhagia.  Normal labs on 03/01/2018 revealed:  PT 13.2 (INR 1.01),   PTT 32, platelet function assay, and von Willebrand panel (factor VIII 222%, ristocetin co-factor 128%, von willebrand antigen 149%).  Urinalysis revealed no hematuria or proteinuria.  She has a history of fibromyalgia.  She is followed by Dr. Annalee Genta.  She has family history of breast and ovarian cancer.  Symptomatically, she continues to have diffuse bone pain.  She is Hemoglobin has improved from 8.9 to 9.7.  Alkaline phosphatase is 165.  Plan: 1.  Iron deficiency  Hemoglobin had improved from 8.9 to 9.7 in 1 month.  Ferritin was 11 (low) on 03/01/2018.  Etiology felt secondary to heavy menses.  Continue Vitron C.  Recheck labs in 6 weeks (CBC with diff, ferritin). 2.  Low normal B12  Continue oral B12.  B12 goal > 400. 3.  Menorrhagia  Work-up was negative for underlying bleeding diathesis. 4.  Elevated alkaline phosphatase  Patient has diffuse bone pain.  Fractionated alkaline phosphatase was  predominantly liver in origin.  Alkaline phosphatase has decreased to 165 (38-126).  Bone scan revealed multiple areas of abnormal activity in both feet and both ankles.  Lumbar and sacral  plain films revealed mild degenerative changes at L1-L4.  Plain films of feet reveal no bone abnormality.  Orthopedics reviewed.  Agree with follow-up with rheumatology.  Patient referred by Dr Annalee Genta to endocrinology.  May need GI evaluation as alk phos predominantly liver in origin.  Will defer further evaluation to other specialists. 5.  RTC in 3 months for MD assessment, labs (CBC with diff, ferritin- day before), and +/- Venofer.   Honor Loh, NP  04/10/2018, 11:38 AM   I saw and evaluated the patient, participating in the key portions of the service and reviewing pertinent diagnostic studies and records.  I reviewed the nurse practitioner's note and agree with the findings and the plan.  The assessment and plan were discussed with the patient. Several questions were asked by the patient and answered.   Nolon Stalls, MD 04/10/2018,11:38 AM

## 2018-04-10 ENCOUNTER — Inpatient Hospital Stay (HOSPITAL_BASED_OUTPATIENT_CLINIC_OR_DEPARTMENT_OTHER): Payer: Medicaid Other | Admitting: Hematology and Oncology

## 2018-04-10 ENCOUNTER — Encounter: Payer: Self-pay | Admitting: Hematology and Oncology

## 2018-04-10 VITALS — BP 121/77 | HR 78 | Temp 97.8°F | Resp 18 | Ht 66.0 in | Wt 270.1 lb

## 2018-04-10 DIAGNOSIS — E538 Deficiency of other specified B group vitamins: Secondary | ICD-10-CM

## 2018-04-10 DIAGNOSIS — G40909 Epilepsy, unspecified, not intractable, without status epilepticus: Secondary | ICD-10-CM

## 2018-04-10 DIAGNOSIS — M898X9 Other specified disorders of bone, unspecified site: Secondary | ICD-10-CM

## 2018-04-10 DIAGNOSIS — R7989 Other specified abnormal findings of blood chemistry: Secondary | ICD-10-CM

## 2018-04-10 DIAGNOSIS — M797 Fibromyalgia: Secondary | ICD-10-CM | POA: Diagnosis not present

## 2018-04-10 DIAGNOSIS — D509 Iron deficiency anemia, unspecified: Secondary | ICD-10-CM | POA: Diagnosis not present

## 2018-04-10 DIAGNOSIS — K219 Gastro-esophageal reflux disease without esophagitis: Secondary | ICD-10-CM

## 2018-04-10 DIAGNOSIS — R748 Abnormal levels of other serum enzymes: Secondary | ICD-10-CM

## 2018-04-10 DIAGNOSIS — R948 Abnormal results of function studies of other organs and systems: Secondary | ICD-10-CM

## 2018-04-10 DIAGNOSIS — R0600 Dyspnea, unspecified: Secondary | ICD-10-CM

## 2018-04-10 DIAGNOSIS — N920 Excessive and frequent menstruation with regular cycle: Secondary | ICD-10-CM

## 2018-04-10 DIAGNOSIS — E039 Hypothyroidism, unspecified: Secondary | ICD-10-CM

## 2018-04-10 DIAGNOSIS — Z8041 Family history of malignant neoplasm of ovary: Secondary | ICD-10-CM

## 2018-04-10 DIAGNOSIS — R5383 Other fatigue: Secondary | ICD-10-CM

## 2018-04-10 DIAGNOSIS — Z803 Family history of malignant neoplasm of breast: Secondary | ICD-10-CM

## 2018-04-10 DIAGNOSIS — Z79899 Other long term (current) drug therapy: Secondary | ICD-10-CM

## 2018-04-10 DIAGNOSIS — M2550 Pain in unspecified joint: Secondary | ICD-10-CM

## 2018-04-10 DIAGNOSIS — Z87891 Personal history of nicotine dependence: Secondary | ICD-10-CM

## 2018-04-10 NOTE — Progress Notes (Signed)
The patient c/o constant pain / whole body pain / pain level 4

## 2018-09-26 ENCOUNTER — Ambulatory Visit: Payer: Medicaid Other | Admitting: Podiatry

## 2019-01-03 ENCOUNTER — Encounter: Payer: Self-pay | Admitting: Emergency Medicine

## 2019-01-03 ENCOUNTER — Emergency Department
Admission: EM | Admit: 2019-01-03 | Discharge: 2019-01-03 | Disposition: A | Discharge status: Home / Self Care | Payer: Medicaid Other | Attending: Emergency Medicine | Admitting: Emergency Medicine

## 2019-01-03 ENCOUNTER — Other Ambulatory Visit: Payer: Self-pay

## 2019-01-03 DIAGNOSIS — Z87891 Personal history of nicotine dependence: Secondary | ICD-10-CM | POA: Insufficient documentation

## 2019-01-03 DIAGNOSIS — K0889 Other specified disorders of teeth and supporting structures: Secondary | ICD-10-CM

## 2019-01-03 DIAGNOSIS — Z79899 Other long term (current) drug therapy: Secondary | ICD-10-CM | POA: Diagnosis not present

## 2019-01-03 MED ORDER — OXYCODONE-ACETAMINOPHEN 5-325 MG PO TABS
1.0000 | ORAL_TABLET | Freq: Once | ORAL | Status: AC
  Administered 2019-01-03: 06:00:00 1 via ORAL
  Filled 2019-01-03: qty 1

## 2019-01-03 MED ORDER — BUPIVACAINE HCL (PF) 0.5 % IJ SOLN
30.0000 mL | Freq: Once | INTRAMUSCULAR | Status: AC
  Administered 2019-01-03: 30 mL
  Filled 2019-01-03: qty 30

## 2019-01-03 MED ORDER — OXYCODONE-ACETAMINOPHEN 5-325 MG PO TABS: 1.0000 | ORAL_TABLET | Freq: Four times a day (QID) | ORAL | 0 refills | Status: AC | PRN

## 2019-01-03 MED ORDER — BUPIVACAINE HCL 0.5 % IJ SOLN: 30.0000 mL | Freq: Once | INTRAMUSCULAR | Status: DC

## 2019-01-03 NOTE — ED Triage Notes (Signed)
Patient ambulatory to triage with steady gait, without difficulty or distress noted, mask in place; pt reports left upper toothache "for awhile", st needs a root canal

## 2019-01-03 NOTE — Discharge Instructions (Addendum)
Please seek medical attention for any high fevers, chest pain, shortness of breath, change in behavior, persistent vomiting, bloody stool or any other new or concerning symptoms.  

## 2019-01-03 NOTE — ED Provider Notes (Signed)
[facility] Emergency Department Provider Note  ____________________________________________   I have reviewed the triage vital signs and the nursing notes.   HISTORY  Chief Complaint Dental Pain   History limited by: Not Limited   HPI Colleen Grimes is a 38 y.o. female who presents to the emergency department today because of concerns for dental pain.  She has had the pain somewhat on and off for a couple of weeks.  She states however tonight the pain was the most severe it is ever been.  She did see her dentist who put her on antibiotics and was referring her to get a root canal.  The patient states that the initial course of antibiotics did seem to help somewhat.  The patient then got put on another round of antibiotics primarily for an abscess but that would also help with her tooth without any significant help.  Patient denies any fevers.   Records reviewed. Per medical record review patient has a history of seizure.   Past Medical History:  Diagnosis Date  . Acid reflux   . Anemia   . BRCA negative 03/2017   MyRisk neg  . Dyspnea    occasionally  . Epilepsy ([place])   . Family history of ovarian cancer 03/2017   MyRisk neg  . Hypothyroidism   . Seizures ([place])    no seizure in several years    Patient Active Problem List   Diagnosis Date Noted  . Abnormal radionuclide bone scan 03/27/2018  . Elevated alkaline phosphatase level 03/18/2018  . Bone pain 03/09/2018  . Menorrhagia with regular cycle 03/09/2018  . Low vitamin B12 level 03/09/2018  . Iron deficiency anemia 03/01/2018  . Chest pain on exertion 01/26/2018  . Malaise and fatigue 01/26/2018  . Shortness of breath on exertion 01/26/2018  . Snoring 01/26/2018  . Acute respiratory distress 06/24/2017  . Community acquired pneumonia 06/09/2017  . BRCA negative 04/04/2017  . Right foot pain 11/10/2016  . Bursitis of left elbow 07/26/2016  . Fibromyalgia muscle pain 01/26/2016  . Muscle  strain 01/26/2016  . Vitamin D deficiency 01/26/2016  . Heartburn   . Acquired hypothyroidism 12/23/2015  . Anxiety state 12/23/2015  . Chronic fatigue, unspecified 12/15/2015  . Mild obesity 12/15/2015  . Multiple joint pain 12/15/2015  . GERD (gastroesophageal reflux disease) 12/09/2015  . Seizures ([place]) 12/09/2015    Past Surgical History:  Procedure Laterality Date  . CESAREAN SECTION    . ESOPHAGOGASTRODUODENOSCOPY (EGD) WITH PROPOFOL N/A 01/02/2016   Procedure: ESOPHAGOGASTRODUODENOSCOPY (EGD) WITH PROPOFOL;  Surgeon: Lucilla Lame, MD;  Location: [facility];  Service: Endoscopy;  Laterality: N/A;  . TONSILLECTOMY    . TUBAL LIGATION      Prior to Admission medications   Medication Sig Start Date End Date Taking? Authorizing Provider  albuterol (PROVENTIL HFA;VENTOLIN HFA) 108 (90 Base) MCG/ACT inhaler Inhale into the lungs. 06/10/17 06/26/18  [provider]  busPIRone (BUSPAR) 10 MG tablet Take 10 mg by mouth 2 (two) times daily.    [provider]  cyclobenzaprine (FLEXERIL) 10 MG tablet Take 10 mg by mouth 3 (three) times daily as needed for muscle spasms.    [provider]  escitalopram (LEXAPRO) 10 MG tablet Take by mouth. 12/04/15 04/10/18  [provider]  Ipratropium-Albuterol (COMBIVENT) 20-100 MCG/ACT AERS respimat Inhale 1 puff into the lungs 4 (four) times daily. 01/20/18   [provider]  Iron-Vitamin C 65-125 MG TABS 1 tablet daily x 1 week, then  increase to 1 tablet twice a day. 03/01/18   Karen Kitchens, NP  omeprazole (PRILOSEC) 40 MG capsule Take by mouth.    [provider]    Allergies Dilantin [phenytoin sodium extended]  Family History  Problem Relation Age of Onset  . Ovarian cancer Mother   . Vaginal cancer Mother   . Cancer Mother   . Breast cancer Paternal Aunt   . Diabetes Paternal Grandmother   . Diabetes Paternal Grandfather   . Colon cancer Neg Hx   . Heart disease Neg Hx      Social History Social History   Tobacco Use  . Smoking status: Former Research scientist (life sciences)  . Smokeless tobacco: Never Used  Substance Use Topics  . Alcohol use: No  . Drug use: No    Review of Systems Constitutional: No fever/chills Eyes: No visual changes. ENT: Positive for dental pain. Cardiovascular: Denies chest pain. Respiratory: Denies shortness of breath. Gastrointestinal: No abdominal pain.  No nausea, no vomiting.  No diarrhea.   Genitourinary: Negative for dysuria. Musculoskeletal: Negative for back pain. Skin: Negative for rash. Neurological: Negative for headaches, focal weakness or numbness.  ____________________________________________   PHYSICAL EXAM:  VITAL SIGNS: ED Triage Vitals  Enc Vitals Group     BP 01/03/19 0233 140/64     Pulse Rate 01/03/19 0233 78     Resp 01/03/19 0233 20     Temp 01/03/19 0233 97.6 F (36.4 C)     Temp Source 01/03/19 0233 Oral     SpO2 01/03/19 0233 98 %     Weight 01/03/19 0232 278 lb (126.1 kg)     Height 01/03/19 0232 5' 6" (1.676 m)     Head Circumference --      Peak Flow --      Pain Score 01/03/19 0232 10   Constitutional: Alert and oriented.  Eyes: Conjunctivae are normal.  ENT      Head: Normocephalic and atraumatic.      Nose: No congestion/rhinnorhea.      Mouth/Throat: Mucous membranes are moist.      Neck: No stridor. Hematological/Lymphatic/Immunilogical: No cervical lymphadenopathy. Cardiovascular: Normal rate, regular rhythm.  No murmurs, rubs, or gallops.  Respiratory: Normal respiratory effort without tachypnea nor retractions. Breath sounds are clear and equal bilaterally. No wheezes/rales/rhonchi. Gastrointestinal: Soft and non tender. No rebound. No guarding.  Genitourinary: Deferred Musculoskeletal: Normal range of motion in all extremities. No lower extremity edema. Neurologic:  Normal speech and language. No gross focal neurologic deficits are appreciated.  Skin:  Skin is warm, dry and intact. No  rash noted. Psychiatric: Mood and affect are normal. Speech and behavior are normal. Patient exhibits appropriate insight and judgment.  ____________________________________________    LABS (pertinent positives/negatives)  None  ____________________________________________   EKG  None  ____________________________________________    RADIOLOGY  None  ____________________________________________   PROCEDURES  Procedures  DENTAL NERVE BLOCK Performed by: Nance Pear Consent: Verbal consent obtained.  Indication: Dental pain Nerve block body site: left upper jaw  Needle gauge: 25 G Location technique: anatomical landmarks  Local anesthetic: 0.5% bupivicaine  Anesthetic total: 3 ml  Outcome: pain improved Patient tolerance: Patient tolerated the procedure well with no immediate complications.  ____________________________________________   INITIAL IMPRESSION / ASSESSMENT AND PLAN / ED COURSE  Pertinent labs & imaging results that were available during my care of the patient were reviewed by me and considered in my medical decision making (see chart for details).   Patient presented to the emergency  department today because of concerns for dental pain.  Pain located in the left upper jaw.  Patient states that she is already seen her dentist about this and they are working on getting her a root canal.  Patient was given bupivacaine nerve block with some improvement in symptoms but still continued to have some pain.  Will give patient prescription for pain medications.  She has been on antibiotics for this.  ____________________________________________   FINAL CLINICAL IMPRESSION(S) / ED DIAGNOSES  Final diagnoses:  Pain, dental     Note: This dictation was prepared with Dragon dictation. Any transcriptional errors that result from this process are unintentional     Nance Pear, MD 01/03/19 [phone]

## 2019-04-05 NOTE — H&P (Signed)
Colleen Grimes is a 39 y.o. female here for [place] . Pt here for follow up  Pt with a ling h/o of heavy bleeding . Pt with 7 days ++ clots and has a history of anemia and receives IV Iron . Husband with vasectomy and she is s/p BTL G4P3 C/s x 3 + dyspareunia intermittent and positional  Pap neg , EMBX : neg   Past Medical History:  has a past medical history of Anemia, GERD (gastroesophageal reflux disease), and Seizures (CMS-HCC).  Past Surgical History:  has a past surgical history that includes Cesarean section and Tubal ligation (2007, 2011). Family History: family history includes Alcohol abuse in her father and mother; Bipolar disorder in her brother and mother; Deep vein thrombosis (DVT or abnormal blood clot formation) in her paternal grandmother; Osteoarthritis in her brother; Ovarian cancer in her mother. Social History:  reports that she quit smoking about 9 years ago. She has never used smokeless tobacco. She reports that she does not drink alcohol. OB/GYN History:          OB History    Gravida  4   Para  3   Term      Preterm      AB  1   Living  3     SAB      TAB      Ectopic      Molar      Multiple      Live Births  3          Allergies: has No Known Allergies. Medications:  Current Outpatient Medications:  .  albuterol (PROAIR HFA) 90 mcg/actuation inhaler, ProAir HFA 90 mcg/actuation aerosol inhaler, Disp: , Rfl:  .  budesonide-formoteroL (SYMBICORT) 160-4.5 mcg/actuation inhaler, Symbicort 160 mcg-4.5 mcg/actuation HFA aerosol inhaler  INHALE 2 PUFFS BY MOUTH TWICE DAILY, Disp: , Rfl:  .  busPIRone (BUSPAR) 10 MG tablet, Take 1 tablet by mouth twice daily, Disp: 60 tablet, Rfl: 0 .  clindamycin (CLEOCIN T) 1 % topical solution, Apply topically 2 (two) times daily, Disp: 30 mL, Rfl: 1 .  clonazePAM (KLONOPIN) 0.5 MG tablet, TAKE 1 TABLET BY MOUTH TWICE DAILY AS NEEDED FOR ANXIETY FOR UP TO 30 DAYS, Disp: , Rfl:  .  cyanocobalamin  (VITAMIN B12) 1000 MCG tablet, Take 1,000 mcg by mouth once daily, Disp: , Rfl:  .  ergocalciferol, vitamin D2, 1,250 mcg (50,000 unit) capsule, Take 1 capsule (50,000 Units total) by mouth once a week, Disp: 12 capsule, Rfl: 1 .  escitalopram oxalate (LEXAPRO) 20 MG tablet, Take 1 tablet (20 mg total) by mouth once daily, Disp: 30 tablet, Rfl: 3 .  ferrous fumarate-vitamin C (VITRON-C) 65 mg iron- 125 mg tablet, 1 tablet daily x 1 week, then increase to 1 tablet twice a day., Disp: , Rfl:  .  ipratropium-albuteroL (COMBIVENT RESPIMAT) 20-100 mcg/actuation inhaler, Inhale 1 inhalation into the lungs 4 (four) times daily as needed, Disp: 4 g, Rfl: 1 .  levothyroxine (SYNTHROID) 50 MCG tablet, Take 1 tablet (50 mcg total) by mouth once daily, Disp: 30 tablet, Rfl: 6 .  OMEPRAZOLE (PRILOSEC ORAL), Take by mouth., Disp: , Rfl:  .  tranexamic acid (LYSTEDA) 650 mg tablet, Take 2 tablets (1,300 mg total) by mouth 3 (three) times daily Take for a maximum of 5 days during monthly menstruation., Disp: 30 tablet, Rfl: 3  Review of Systems: General:  No fatigue or weight loss Eyes:                           No vision changes Ears:                            No hearing difficulty Respiratory:                No cough or shortness of breath Pulmonary:                  No asthma or shortness of breath Cardiovascular:           No chest pain, palpitations, dyspnea on exertion Gastrointestinal:          No abdominal bloating, chronic diarrhea, constipations, masses, pain or hematochezia Genitourinary:             No hematuria, dysuria, abnormal vaginal discharge, pelvic pain, +Menometrorrhagia Lymphatic:                   No swollen lymph nodes Musculoskeletal:         No muscle weakness Neurologic:                  No extremity weakness, syncope, seizure disorder Psychiatric:                  No history of depression, delusions or suicidal/homicidal ideation    Exam:      Vitals:    03/27/19 1428  BP: 119/81  Pulse: 70    Body mass index is 43.74 kg/m.  WDWN white/  female in NAD   Lungs: CTA  CV : RRR without murmur    Neck:  no thyromegaly Abdomen: soft , no mass, normal active bowel sounds,  non-tender, no rebound tenderness Pelvic: tanner stage 5 ,  External genitalia: vulva /labia no lesions Urethra: no prolapse Vagina: normal physiologic d/c, limited room for a TVH  Cervix: high in vault no lesions, no cervical motion tenderness   Uterus: normal size shape and contour, non-tender Adnexa: no mass,  non-tender   Rectovaginal: no mass heme negative Saline infusion sonohysterography: betadine prep to the cervix followed by placement of the HSG catheter into the endometrial canal . Sterile H2O is injected while performing a transvaginal u/s . Findings:no endometrial masses   Impression:   The primary encounter diagnosis was Menorrhagia with regular cycle. A diagnosis of Dyspareunia, female was also pertinent to this visit. Possible adenomyosis, possible intraabdominal adhesions given her 3 c/s and scar tissue that was noted     Plan:   I had a long detailed conversation regarding options I have spoken with the patient regarding treatment options including expectant management, hormonal options, or surgical intervention. After a full discussion the pt elects to proceed with [facility] and bilateral salpingectomy . She has been counseled regarding the risk of occult uterine cancer and upstaging her cancer . Risk is 1/770-1/10,000. She is also aware that I may not be able to complete the procedure and have to convert to an open laparotomy .    I would not do a oophorectomy given her mother's h/o of ovarian cancer. Continue with iron infusion until surgery and she will start Lysteda 1300 mg TID x 5 days to help contro bleeding until surgery 50 minutes in direct examination review of hospital records of last c/s and coordination of care   .  Caroline Sauger, MD

## 2019-04-11 ENCOUNTER — Encounter
Admission: RE | Admit: 2019-04-11 | Discharge: 2019-04-11 | Disposition: A | Discharge status: Home / Self Care | Payer: Medicaid Other | Source: Ambulatory Visit | Attending: Obstetrics and Gynecology | Admitting: Obstetrics and Gynecology

## 2019-04-11 ENCOUNTER — Other Ambulatory Visit: Payer: Self-pay

## 2019-04-11 NOTE — Pre-Procedure Instructions (Signed)
Echo stress test11/21/2019 [place] Component Name Value Ref Range  LV Ejection Fraction (%) 55   Aortic Valve Regurgitation Grade none   Aortic Valve Stenosis Grade none   Mitral Valve Regurgitation Grade trivial   Mitral Valve Stenosis Grade none   Tricuspid Valve Regurgitation Grade trivial   Result Narrative             CARDIOLOGY DEPARTMENT          Colleen Grimes, Colleen Grimes                  [id number]      Colleen Grimes #: [id number]      [address] Colleen Grimes, Colleen Grimes [address]    Date: 02/08/2018 03: 00 PM                                Adult  Female  Age: 39 yrs      ECHOCARDIOGRAM REPORT               Outpatient                                [id number]    STUDY:Stress Echo       TAPE:          MD1: STEPHENS, NICOLE LYNN    ECHO:Yes  DOPPLER:Yes    FILE:0000-000-000    BP: 142/86 mmHg    COLOR:Yes  CONTRAST:No   MACHINE:Philips  RV BIOPSY:No     3D:No SOUND QLTY:Moderate      Height: 66 in   MEDIUM:None                       Weight: 268 lb                                BSA: 2.3 m2 _________________________________________________________________________________________        HISTORY: Chest pain, DOE         REASON: Assess, LV function       Indication: R07.9 Chest pain, unspecified, R06.02 Shortness of breath _________________________________________________________________________________________ STRESS ECHOCARDIOGRAPHY        Protocol: Treadmill (Bruce)         Drugs: None       Target HR: 154 bpm      Maximum Predicted HR: 181 bpm +----------------------------------------------------------------------------+ :Stage       Duration           HR     BP         : :  RESTING   :              :75    :142/86       : :---------------+----------------------------+----------+---------+     : : EXERCISE   :3:39            :164    :/          : :---------------+----------------------------+----------+---------+     : : RECOVERY   :4:12            :95    :168/97       : :---------------+----------------------------+----------+---------+     : +----------------------------------------------------------------------------+  Stress Duration: 3: 39 mm: ss    Max Stress H.R.: 164 bpm       Target HR Achieved: Yes _________________________________________________________________________________________ WALL SEGMENT CHANGES             Rest        Stress Anterior Septum Basal: Normal       Hyperkinetic          Mid: Normal       Hyperkinetic         Apical: Normal       Hyperkinetic  Anterior Wall Basal: Normal       Hyperkinetic          Mid: Normal       Hyperkinetic         Apical: Normal       Hyperkinetic   Lateral Wall Basal: Normal       Hyperkinetic          Mid: Normal       Hyperkinetic         Apical: Normal       Hyperkinetic  Posterior Wall Basal: Normal       Hyperkinetic          Mid: Normal       Hyperkinetic  Inferior Wall Basal: Normal       Hyperkinetic          Mid: Normal       Hyperkinetic         Apical: Normal       Hyperkinetic Inferior Septum Basal: Normal       Hyperkinetic          Mid: Normal       Hyperkinetic       Resting EF: >55% (Est.)         Stress EF: >55%  (Est.)  _________________________________________________________________________________________ ADDITIONAL FINDINGS _________________________________________________________________________________________ STRESS ECG RESULTS      ECG Results: Normal _________________________________________________________________________________________ ECHOCARDIOGRAPHIC DESCRIPTIONS LEFT VENTRICLE          Size: Normal      Contraction: Normal       LV Masses: No Masses          LVH: None RIGHT VENTRICLE          Size: Normal            Free Wall: Normal      Contraction: Normal            RV Masses: No mass PERICARDIUM         Fluid: No effusion _________________________________________________________________________________________  DOPPLER ECHO and OTHER SPECIAL PROCEDURES         Aortic: No AR           No AS         Mitral: TRIVIAL MR         No MS             MV Inflow E Vel = nm*   MV Annulus E'Vel = nm*             E/E'Ratio = nm*       Tricuspid: TRIVIAL TR         No TS       Pulmonary: No PR           No PS _________________________________________________________________________________________ ECHOCARDIOGRAPHIC MEASUREMENTS 2D DIMENSIONS AORTA         Values  Normal Range  MAIN PA     Values  Normal  Range        Annulus: nm*   [2.1-2.5]       PA Main: nm*    [1.5-2.1]       Aorta Sin: nm*   [2.7-3.3]  RIGHT VENTRICLE      ST Junction: nm*   [2.3-2.9]       RV Base: nm*    [<4.2]       Asc.Aorta: nm*   [2.3-3.1]        RV Mid: nm*    [<3.5] LEFT VENTRICLE                   RV Length: nm*    [<8.6]         LVIDd: nm*   [3.9-5.3]  INFERIOR VENA CAVA         LVIDs: nm*              Max.  IVC: nm*    [<=2.1]           FS: nm*   [>25]         Min. IVC: nm*          SWT: nm*   [0.5-0.9]  ------------------          PWT: nm*   [0.5-0.9]  nm* - not measured LEFT ATRIUM        LA Diam: nm*   [2.7-3.8]      LA A4C Area: nm*   [<20]       LA Volume: nm*   [22-52] _________________________________________________________________________________________ INTERPRETATION Normal Stress Echocardiogram NORMAL RIGHT VENTRICULAR SYSTOLIC FUNCTION TRIVIAL REGURGITATION NOTED (See above) NO VALVULAR STENOSIS NOTED Resting EF: >55% (Est.) Post Stress EF: >55% (Est.) ECG Results: Normal Tricuspid: TRIVIAL TR _________________________________________________________________________________________ Electronically signed by      MD Jordan Hawks on 02/09/2018 09: 14 AM      Performed By: Johnathan Hausen, RDCS, RVT   Ordering Physician: Doristine Mango _________________________________________________________________________________________  Other Result Information  Interface, Text Results In - 02/09/2018  9:15 AM EST                      CARDIOLOGY DEPARTMENT                   Colleen Grimes, Colleen Grimes                                    [id number]           [facility]                           Acct #: [id number]           [address], [place], [place] [address]       Date: 02/08/2018 03: 00 PM                                                              Adult   Female   Age: 87 yrs           ECHOCARDIOGRAM REPORT  Outpatient                                                              [facility]      STUDY:Stress Echo              TAPE:                    MD1: STEPHENS, NICOLE LYNN       ECHO:Yes    DOPPLER:Yes       FILE:0000-000-000        BP: 142/86 mmHg      COLOR:Yes   CONTRAST:No     MACHINE:Philips  RV BIOPSY:No          3D:No  SOUND QLTY:Moderate             Height: 66 in     MEDIUM:None                                              Weight: 268 lb                                                              BSA: 2.3 m2 _________________________________________________________________________________________               HISTORY: Chest pain, DOE                REASON: Assess, LV function            Indication: R07.9 Chest pain, unspecified, R06.02 Shortness of breath _________________________________________________________________________________________ STRESS ECHOCARDIOGRAPHY              Protocol: Treadmill (Bruce)                 Drugs: None             Target HR: 154 bpm           Maximum Predicted HR: 181 bpm +----------------------------------------------------------------------------+ :Stage           Duration                     HR         BP                  : :   RESTING     :                            :75        :142/86              : :---------------+----------------------------+----------+---------+          : :  EXERCISE     :3:39                        :164       :/                   : :---------------+----------------------------+----------+---------+          : :  RECOVERY     :4:12                        :95        :168/97              : :---------------+----------------------------+----------+---------+          : +----------------------------------------------------------------------------+       Stress Duration: 3: 39 mm: ss       Max Stress H.R.: 164 bpm             Target HR Achieved: Yes _________________________________________________________________________________________ WALL SEGMENT CHANGES                        Rest                Stress Anterior Septum Basal: Normal              Hyperkinetic                   Mid: Normal              Hyperkinetic                Apical: Normal              Hyperkinetic   Anterior Wall Basal: Normal              Hyperkinetic                   Mid: Normal               Hyperkinetic                Apical: Normal              Hyperkinetic    Lateral Wall Basal: Normal              Hyperkinetic                   Mid: Normal              Hyperkinetic                Apical: Normal              Hyperkinetic  Posterior Wall Basal: Normal              Hyperkinetic                   Mid: Normal              Hyperkinetic   Inferior Wall Basal: Normal              Hyperkinetic                   Mid: Normal              Hyperkinetic                Apical: Normal              Hyperkinetic Inferior Septum Basal: Normal              Hyperkinetic                   Mid: Normal              Hyperkinetic  Resting EF: >55% (Est.)                  Stress EF: >55% (Est.)  _________________________________________________________________________________________ ADDITIONAL FINDINGS _________________________________________________________________________________________ STRESS ECG RESULTS           ECG Results: Normal _________________________________________________________________________________________ ECHOCARDIOGRAPHIC DESCRIPTIONS LEFT VENTRICLE                  Size: Normal           Contraction: Normal             LV Masses: No Masses                   LVH: None RIGHT VENTRICLE                  Size: Normal                       Free Wall: Normal           Contraction: Normal                       RV Masses: No mass PERICARDIUM                 Fluid: No effusion _________________________________________________________________________________________  DOPPLER ECHO and OTHER SPECIAL PROCEDURES                Aortic: No AR                      No AS                Mitral: TRIVIAL MR                 No MS                        MV Inflow E Vel = nm*      MV Annulus E'Vel = nm*                        E/E'Ratio = nm*             Tricuspid: TRIVIAL TR                 No TS             Pulmonary: No PR                      No  PS _________________________________________________________________________________________ ECHOCARDIOGRAPHIC MEASUREMENTS 2D DIMENSIONS AORTA                  Values   Normal Range   MAIN PA         Values    Normal Range               Annulus: nm*     [2.1-2.5]              PA Main: nm*       [1.5-2.1]             Aorta Sin: nm*     [2.7-3.3]    RIGHT VENTRICLE           ST Junction: nm*     [2.3-2.9]              RV Base: nm*       [<4.2]  Asc.Aorta: nm*     [2.3-3.1]               RV Mid: nm*       [<3.5] LEFT VENTRICLE                                      RV Length: nm*       [<8.6]                 LVIDd: nm*     [3.9-5.3]    INFERIOR VENA CAVA                 LVIDs: nm*                           Max. IVC: nm*       [<=2.1]                    FS: nm*     [>25]                 Min. IVC: nm*                   SWT: nm*     [0.5-0.9]    ------------------                   PWT: nm*     [0.5-0.9]    nm* - not measured LEFT ATRIUM               LA Diam: nm*     [2.7-3.8]           LA A4C Area: nm*     [<20]             LA Volume: nm*     [22-52] _________________________________________________________________________________________ INTERPRETATION Normal Stress Echocardiogram NORMAL RIGHT VENTRICULAR SYSTOLIC FUNCTION TRIVIAL REGURGITATION NOTED (See above) NO VALVULAR STENOSIS NOTED Resting EF: >55% (Est.) Post Stress EF: >55% (Est.) ECG Results: Normal Tricuspid: TRIVIAL TR _________________________________________________________________________________________ Electronically signed by            MD Jordan Hawks on 02/09/2018 09: 14 AM          Performed By: Johnathan Hausen, RDCS, RVT    Ordering Physician: Doristine Mango _________________________________________________________________________________________  Status Results Details   Encounter Summary

## 2019-04-12 ENCOUNTER — Other Ambulatory Visit
Admission: RE | Admit: 2019-04-12 | Discharge: 2019-04-12 | Disposition: A | Discharge status: Home / Self Care | Payer: Medicaid Other | Source: Ambulatory Visit | Attending: Obstetrics and Gynecology | Admitting: Obstetrics and Gynecology

## 2019-04-12 DIAGNOSIS — Z01818 Encounter for other preprocedural examination: Secondary | ICD-10-CM | POA: Insufficient documentation

## 2019-04-12 DIAGNOSIS — Z20822 Contact with and (suspected) exposure to covid-19: Secondary | ICD-10-CM | POA: Diagnosis not present

## 2019-04-12 HISTORY — DX: Cardiac murmur, unspecified: R01.1

## 2019-04-12 HISTORY — DX: Pneumonia, unspecified organism: J18.9

## 2019-04-12 HISTORY — DX: Unspecified asthma, uncomplicated: J45.909

## 2019-04-12 NOTE — Patient Instructions (Signed)
Your procedure is scheduled on: Tues 1/26 Report to Day Surgery. [facility] To find out your arrival time please call [phone] between [place] on Mon. 1/25.  Remember: Instructions that are not followed completely may result in serious medical risk,  up to and including death, or upon the discretion of your surgeon and anesthesiologist your  surgery may need to be rescheduled.     _X__ 1. Do not eat food after midnight the night before your procedure.                 No gum chewing or hard candies. You may drink clear liquids up to 2 hours                 before you are scheduled to arrive for your surgery- DO not drink clear                 liquids within 2 hours of the start of your surgery.                 Clear Liquids include:  water, apple juice without pulp, clear carbohydrate                 (Ensure)  Complete 2 hours before you arrive                  Gatorade, Black Coffee or Tea (Do not add                 anything to coffee or tea).  __X__2.  On the morning of surgery brush your teeth with toothpaste and water, you                may rinse your mouth with mouthwash if you wish.  Do not swallow any toothpaste of mouthwash.     _ 3.  No Alcohol for 24 hours before or after surgery.   __ 4.  Do Not Smoke or use e-cigarettes For 24 Hours Prior to Your Surgery.                 Do not use any chewable tobacco products for at least 6 hours prior to                 surgery.  ____  5.  Bring all medications with you on the day of surgery if instructed.   _x___  6.  Notify your doctor if there is any change in your medical condition      (cold, fever, infections).     Do not wear jewelry, make-up, hairpins, clips or nail polish. Do not wear lotions, powders, or perfumes. You may wear deodorant. Do not shave 48 hours prior to surgery. Men may shave face and neck. Do not bring valuables to the hospital.    [facility] is not responsible for any  belongings or valuables.  Contacts, dentures or bridgework may not be worn into surgery. Leave your suitcase in the car. After surgery it may be brought to your room. For patients admitted to the hospital, discharge time is determined by your treatment team.   Patients discharged the day of surgery will not be allowed to drive home.   Please read over the following fact sheets that you were given:    __x__ Take these medicines the morning of surgery with A SIP OF WATER:    1.buPROPion (WELLBUTRIN XL) 150 MG 24 hr tablet  2. busPIRone (BUSPAR) 10 MG  tablet  3. clonazePAM (KLONOPIN) 0.5 MG tablet if needed  4.escitalopram (LEXAPRO) 20 MG tablet  5.levothyroxine (SYNTHROID) 50 MCG tablet  6.omeprazole (PRILOSEC) 40 MG capsule   Dose the night before and the morning of surgery  ____ Fleet Enema (as directed)   _x___ Use CHG Soap as directed  _x___ Use inhalers on the day of surgeryalbuterol (PROVENTIL HFA;VENTOLIN HFA) 108 (90 Base) MCG/ACT inhaler and bring with you Ipratropium-Albuterol (COMBIVENT) 20-100 MCG/ACT AERS respimat  ____ Stop metformin 2 days prior to surgery    ____ Take 1/2 of usual insulin dose the night before surgery. No insulin the morning          of surgery.   ____ Stop Coumadin/Plavix/aspirin on   ___x_ Stop Anti-inflammatories   No ibuprofen, aleve or aspirin until after surgery.   May take tylenol   __x__ Stop supplements until after surgery.  May continue your vitamins      Only stop the Vitc iron  __x__ Use C-Pap to the hospital.

## 2019-04-13 ENCOUNTER — Other Ambulatory Visit: Payer: Self-pay

## 2019-04-13 ENCOUNTER — Other Ambulatory Visit
Admission: RE | Admit: 2019-04-13 | Discharge: 2019-04-13 | Disposition: A | Discharge status: Home / Self Care | Payer: Medicaid Other | Source: Ambulatory Visit | Attending: Obstetrics and Gynecology | Admitting: Obstetrics and Gynecology

## 2019-04-13 DIAGNOSIS — Z01818 Encounter for other preprocedural examination: Secondary | ICD-10-CM | POA: Diagnosis not present

## 2019-04-13 LAB — BASIC METABOLIC PANEL
Anion gap: 8 (ref 5–15)
BUN: 11 mg/dL (ref 6–20)
CO2: 29 mmol/L (ref 22–32)
Calcium: 9 mg/dL (ref 8.9–10.3)
Chloride: 102 mmol/L (ref 98–111)
Creatinine, Ser: 0.92 mg/dL (ref 0.44–1.00)
GFR calc Af Amer: >60 mL/min (ref >60)
GFR calc non Af Amer: >60 mL/min (ref >60)
Glucose, Bld: 113 mg/dL — ABNORMAL HIGH (ref 70–99)
Potassium: 3.8 mmol/L (ref 3.5–5.1)
Sodium: 139 mmol/L (ref 135–145)

## 2019-04-13 LAB — CBC
HCT: 43.1 % (ref 36.0–46.0)
Hemoglobin: 13.9 g/dL (ref 12.0–15.0)
MCH: 28.5 pg (ref 26.0–34.0)
MCHC: 32.3 g/dL (ref 30.0–36.0)
MCV: 88.3 fL (ref 80.0–100.0)
Platelets: 361 10*3/uL (ref 150–400)
RBC: 4.88 MIL/uL (ref 3.87–5.11)
RDW: 13.3 % (ref 11.5–15.5)
WBC: 6.7 10*3/uL (ref 4.0–10.5)
nRBC: 0 % (ref 0.0–0.2)

## 2019-04-13 LAB — TYPE AND SCREEN
ABO/RH(D): O POS
Antibody Screen: NEGATIVE

## 2019-04-13 LAB — SARS CORONAVIRUS 2 (TAT 6-24 HRS): SARS Coronavirus 2: NEGATIVE

## 2019-04-17 ENCOUNTER — Other Ambulatory Visit: Payer: Self-pay

## 2019-04-17 ENCOUNTER — Encounter
Admission: RE | Disposition: A | Discharge status: Home / Self Care | Payer: Self-pay | Source: Home / Self Care | Attending: Obstetrics and Gynecology

## 2019-04-17 ENCOUNTER — Ambulatory Visit: Payer: Medicaid Other | Admitting: Anesthesiology

## 2019-04-17 ENCOUNTER — Other Ambulatory Visit: Payer: Medicaid Other

## 2019-04-17 ENCOUNTER — Ambulatory Visit
Admission: RE | Admit: 2019-04-17 | Discharge: 2019-04-17 | Disposition: A | Discharge status: Home / Self Care | Payer: Medicaid Other | Attending: Obstetrics and Gynecology | Admitting: Obstetrics and Gynecology

## 2019-04-17 ENCOUNTER — Encounter: Payer: Self-pay | Admitting: Obstetrics and Gynecology

## 2019-04-17 DIAGNOSIS — D251 Intramural leiomyoma of uterus: Secondary | ICD-10-CM | POA: Diagnosis not present

## 2019-04-17 DIAGNOSIS — G4730 Sleep apnea, unspecified: Secondary | ICD-10-CM | POA: Diagnosis not present

## 2019-04-17 DIAGNOSIS — Z79899 Other long term (current) drug therapy: Secondary | ICD-10-CM | POA: Diagnosis not present

## 2019-04-17 DIAGNOSIS — K219 Gastro-esophageal reflux disease without esophagitis: Secondary | ICD-10-CM | POA: Insufficient documentation

## 2019-04-17 DIAGNOSIS — Z79890 Hormone replacement therapy (postmenopausal): Secondary | ICD-10-CM | POA: Diagnosis not present

## 2019-04-17 DIAGNOSIS — N941 Unspecified dyspareunia: Secondary | ICD-10-CM | POA: Insufficient documentation

## 2019-04-17 DIAGNOSIS — J45909 Unspecified asthma, uncomplicated: Secondary | ICD-10-CM | POA: Diagnosis not present

## 2019-04-17 DIAGNOSIS — Z87891 Personal history of nicotine dependence: Secondary | ICD-10-CM | POA: Diagnosis not present

## 2019-04-17 DIAGNOSIS — N838 Other noninflammatory disorders of ovary, fallopian tube and broad ligament: Secondary | ICD-10-CM | POA: Diagnosis not present

## 2019-04-17 DIAGNOSIS — Z7951 Long term (current) use of inhaled steroids: Secondary | ICD-10-CM | POA: Insufficient documentation

## 2019-04-17 DIAGNOSIS — N736 Female pelvic peritoneal adhesions (postinfective): Secondary | ICD-10-CM | POA: Insufficient documentation

## 2019-04-17 DIAGNOSIS — G40909 Epilepsy, unspecified, not intractable, without status epilepticus: Secondary | ICD-10-CM | POA: Diagnosis not present

## 2019-04-17 DIAGNOSIS — Z8041 Family history of malignant neoplasm of ovary: Secondary | ICD-10-CM | POA: Insufficient documentation

## 2019-04-17 DIAGNOSIS — D649 Anemia, unspecified: Secondary | ICD-10-CM | POA: Diagnosis not present

## 2019-04-17 DIAGNOSIS — N800 Endometriosis of uterus: Secondary | ICD-10-CM | POA: Diagnosis not present

## 2019-04-17 DIAGNOSIS — E039 Hypothyroidism, unspecified: Secondary | ICD-10-CM | POA: Diagnosis not present

## 2019-04-17 DIAGNOSIS — N920 Excessive and frequent menstruation with regular cycle: Secondary | ICD-10-CM | POA: Insufficient documentation

## 2019-04-17 HISTORY — PX: LAPAROSCOPIC SUPRACERVICAL HYSTERECTOMY: SHX5399

## 2019-04-17 HISTORY — PX: CYSTOSCOPY: SHX5120

## 2019-04-17 HISTORY — PX: LAPAROSCOPIC LYSIS OF ADHESIONS: SHX5905

## 2019-04-17 HISTORY — DX: Failed or difficult intubation, initial encounter: T88.4XXA

## 2019-04-17 HISTORY — PX: LAPAROSCOPIC UNILATERAL SALPINGECTOMY: SHX5934

## 2019-04-17 LAB — ABO/RH: ABO/RH(D): O POS

## 2019-04-17 LAB — POCT PREGNANCY, URINE: Preg Test, Ur: NEGATIVE

## 2019-04-17 SURGERY — HYSTERECTOMY, SUPRACERVICAL, LAPAROSCOPIC
Anesthesia: General | Laterality: Right

## 2019-04-17 MED ORDER — OXYCODONE-ACETAMINOPHEN 5-325 MG PO TABS
1.0000 | ORAL_TABLET | ORAL | Status: DC | PRN
  Administered 2019-04-17: 1 via ORAL

## 2019-04-17 MED ORDER — CEFAZOLIN SODIUM-DEXTROSE 2-4 GM/100ML-% IV SOLN
2.0000 g | Freq: Once | INTRAVENOUS | Status: AC
  Administered 2019-04-17: 3 g via INTRAVENOUS

## 2019-04-17 MED ORDER — MIDAZOLAM HCL 2 MG/2ML IJ SOLN
INTRAMUSCULAR | Status: AC
  Filled 2019-04-17: qty 2

## 2019-04-17 MED ORDER — ROCURONIUM BROMIDE 100 MG/10ML IV SOLN
INTRAVENOUS | Status: DC | PRN
  Administered 2019-04-17 (×3): 10 mg via INTRAVENOUS
  Administered 2019-04-17: 40 mg via INTRAVENOUS
  Administered 2019-04-17 (×2): 20 mg via INTRAVENOUS
  Administered 2019-04-17: 10 mg via INTRAVENOUS

## 2019-04-17 MED ORDER — FENTANYL CITRATE (PF) 100 MCG/2ML IJ SOLN
INTRAMUSCULAR | Status: AC
  Filled 2019-04-17: qty 2

## 2019-04-17 MED ORDER — PHENYLEPHRINE HCL (PRESSORS) 10 MG/ML IV SOLN
INTRAVENOUS | Status: DC | PRN
  Administered 2019-04-17: 200 ug via INTRAVENOUS
  Administered 2019-04-17 (×2): 100 ug via INTRAVENOUS

## 2019-04-17 MED ORDER — BUPIVACAINE HCL 0.5 % IJ SOLN
INTRAMUSCULAR | Status: DC | PRN
  Administered 2019-04-17: 10 mL

## 2019-04-17 MED ORDER — LACTATED RINGERS IV SOLN: INTRAVENOUS | Status: DC

## 2019-04-17 MED ORDER — KETOROLAC TROMETHAMINE 30 MG/ML IJ SOLN
INTRAMUSCULAR | Status: AC
  Filled 2019-04-17: qty 1

## 2019-04-17 MED ORDER — SUGAMMADEX SODIUM 500 MG/5ML IV SOLN
INTRAVENOUS | Status: AC
  Filled 2019-04-17: qty 5

## 2019-04-17 MED ORDER — METHYLENE BLUE 0.5 % INJ SOLN
INTRAVENOUS | Status: AC
  Filled 2019-04-17: qty 10

## 2019-04-17 MED ORDER — FENTANYL CITRATE (PF) 100 MCG/2ML IJ SOLN
25.0000 ug | INTRAMUSCULAR | Status: DC | PRN
  Administered 2019-04-17 (×3): 50 ug via INTRAVENOUS

## 2019-04-17 MED ORDER — DEXAMETHASONE SODIUM PHOSPHATE 10 MG/ML IJ SOLN
INTRAMUSCULAR | Status: DC | PRN
  Administered 2019-04-17: 8 mg via INTRAVENOUS

## 2019-04-17 MED ORDER — OXYCODONE HCL 5 MG PO TABS
5.0000 mg | ORAL_TABLET | Freq: Once | ORAL | Status: AC | PRN
  Administered 2019-04-17: 5 mg via ORAL

## 2019-04-17 MED ORDER — FLUORESCEIN SODIUM 10 % IV SOLN
INTRAVENOUS | Status: AC
  Filled 2019-04-17: qty 5

## 2019-04-17 MED ORDER — OXYCODONE HCL 5 MG/5ML PO SOLN: 5.0000 mg | Freq: Once | ORAL | Status: AC | PRN

## 2019-04-17 MED ORDER — OXYCODONE HCL 5 MG PO TABS
ORAL_TABLET | ORAL | Status: AC
  Filled 2019-04-17: qty 1

## 2019-04-17 MED ORDER — EPHEDRINE SULFATE 50 MG/ML IJ SOLN
INTRAMUSCULAR | Status: AC
  Filled 2019-04-17: qty 1

## 2019-04-17 MED ORDER — GABAPENTIN 300 MG PO CAPS: 300.0000 mg | ORAL_CAPSULE | ORAL | Status: AC

## 2019-04-17 MED ORDER — ONDANSETRON 4 MG PO TBDP: 4.0000 mg | ORAL_TABLET | Freq: Four times a day (QID) | ORAL | Status: DC | PRN

## 2019-04-17 MED ORDER — LIDOCAINE HCL URETHRAL/MUCOSAL 2 % EX GEL
CUTANEOUS | Status: AC
  Filled 2019-04-17: qty 5

## 2019-04-17 MED ORDER — ACETAMINOPHEN 500 MG PO TABS: 1000.0000 mg | ORAL_TABLET | ORAL | Status: AC

## 2019-04-17 MED ORDER — FENTANYL CITRATE (PF) 100 MCG/2ML IJ SOLN
INTRAMUSCULAR | Status: DC | PRN
  Administered 2019-04-17 (×2): 25 ug via INTRAVENOUS
  Administered 2019-04-17 (×2): 50 ug via INTRAVENOUS

## 2019-04-17 MED ORDER — KETOROLAC TROMETHAMINE 30 MG/ML IJ SOLN
INTRAMUSCULAR | Status: DC | PRN
  Administered 2019-04-17: 30 mg via INTRAVENOUS

## 2019-04-17 MED ORDER — CEFAZOLIN SODIUM-DEXTROSE 2-4 GM/100ML-% IV SOLN
INTRAVENOUS | Status: AC
  Filled 2019-04-17: qty 100

## 2019-04-17 MED ORDER — BUPIVACAINE HCL (PF) 0.5 % IJ SOLN
INTRAMUSCULAR | Status: AC
  Filled 2019-04-17: qty 30

## 2019-04-17 MED ORDER — SILVER NITRATE-POT NITRATE 75-25 % EX MISC
CUTANEOUS | Status: AC
  Filled 2019-04-17: qty 10

## 2019-04-17 MED ORDER — EPHEDRINE SULFATE 50 MG/ML IJ SOLN
INTRAMUSCULAR | Status: DC | PRN
  Administered 2019-04-17 (×3): 5 mg via INTRAVENOUS

## 2019-04-17 MED ORDER — FLUORESCEIN SODIUM 10 % IV SOLN
INTRAVENOUS | Status: DC | PRN
  Administered 2019-04-17: .5 mL via INTRAVENOUS

## 2019-04-17 MED ORDER — ROCURONIUM BROMIDE 50 MG/5ML IV SOLN
INTRAVENOUS | Status: AC
  Filled 2019-04-17: qty 1

## 2019-04-17 MED ORDER — PROPOFOL 10 MG/ML IV BOLUS
INTRAVENOUS | Status: AC
  Filled 2019-04-17: qty 20

## 2019-04-17 MED ORDER — ONDANSETRON HCL 4 MG/2ML IJ SOLN
INTRAMUSCULAR | Status: DC | PRN
  Administered 2019-04-17: 4 mg via INTRAVENOUS

## 2019-04-17 MED ORDER — GABAPENTIN 300 MG PO CAPS
ORAL_CAPSULE | ORAL | Status: AC
  Administered 2019-04-17: 300 mg via ORAL
  Filled 2019-04-17: qty 1

## 2019-04-17 MED ORDER — MIDAZOLAM HCL 2 MG/2ML IJ SOLN
INTRAMUSCULAR | Status: DC | PRN
  Administered 2019-04-17: 2 mg via INTRAVENOUS

## 2019-04-17 MED ORDER — SEVOFLURANE IN SOLN
RESPIRATORY_TRACT | Status: AC
  Filled 2019-04-17: qty 250

## 2019-04-17 MED ORDER — PROPOFOL 10 MG/ML IV BOLUS
INTRAVENOUS | Status: DC | PRN
  Administered 2019-04-17: 20 mg via INTRAVENOUS
  Administered 2019-04-17: 180 mg via INTRAVENOUS

## 2019-04-17 MED ORDER — ACETAMINOPHEN 500 MG PO TABS
ORAL_TABLET | ORAL | Status: AC
  Administered 2019-04-17: 1000 mg via ORAL
  Filled 2019-04-17: qty 2

## 2019-04-17 MED ORDER — OXYCODONE-ACETAMINOPHEN 5-325 MG PO TABS
ORAL_TABLET | ORAL | Status: AC
  Filled 2019-04-17: qty 1

## 2019-04-17 SURGICAL SUPPLY — 56 items
APPLICATOR COTTON TIP 6 STRL (MISCELLANEOUS) ×8 IMPLANT
APPLICATOR COTTON TIP 6IN STRL (MISCELLANEOUS) ×12
BAG URINE DRAIN 2000ML AR STRL (UROLOGICAL SUPPLIES) ×6 IMPLANT
BLADE SURG SZ11 CARB STEEL (BLADE) ×6 IMPLANT
CANISTER SUCT 1200ML W/VALVE (MISCELLANEOUS) ×6 IMPLANT
CATH FOLEY 2WAY  5CC 16FR (CATHETERS) ×2
CATH ROBINSON RED A/P 16FR (CATHETERS) ×6 IMPLANT
CATH URTH 16FR FL 2W BLN LF (CATHETERS) ×4 IMPLANT
CHLORAPREP W/TINT 26 (MISCELLANEOUS) ×6 IMPLANT
CLOSURE WOUND 1/2 X4 (GAUZE/BANDAGES/DRESSINGS) ×1
CLOSURE WOUND 1/4X4 (GAUZE/BANDAGES/DRESSINGS) ×1
COVER WAND RF STERILE (DRAPES) IMPLANT
DRSG TEGADERM 2-3/8X2-3/4 SM (GAUZE/BANDAGES/DRESSINGS) ×18 IMPLANT
GLOVE BIO SURGEON STRL SZ8 (GLOVE) ×30 IMPLANT
GLOVE SURG SYN 6.5 ES PF (GLOVE) ×24 IMPLANT
GOWN STRL REUS W/ TWL LRG LVL3 (GOWN DISPOSABLE) ×8 IMPLANT
GOWN STRL REUS W/ TWL XL LVL3 (GOWN DISPOSABLE) ×4 IMPLANT
GOWN STRL REUS W/TWL LRG LVL3 (GOWN DISPOSABLE) ×4
GOWN STRL REUS W/TWL XL LVL3 (GOWN DISPOSABLE) ×2
GRASPER SUT TROCAR 14GX15 (MISCELLANEOUS) ×6 IMPLANT
IRRIGATION STRYKERFLOW (MISCELLANEOUS) ×4 IMPLANT
IRRIGATOR STRYKERFLOW (MISCELLANEOUS) ×6
IV LACTATED RINGERS 1000ML (IV SOLUTION) ×6 IMPLANT
IV NS 1000ML (IV SOLUTION)
IV NS 1000ML BAXH (IV SOLUTION) IMPLANT
KIT PINK PAD W/HEAD ARE REST (MISCELLANEOUS) ×6
KIT PINK PAD W/HEAD ARM REST (MISCELLANEOUS) ×4 IMPLANT
KIT TURNOVER CYSTO (KITS) ×6 IMPLANT
KITTNER LAPARASCOPIC 5X40 (MISCELLANEOUS) ×6 IMPLANT
LABEL OR SOLS (LABEL) ×6 IMPLANT
MORCELLATOR XCISE  COR (MISCELLANEOUS) ×2
MORCELLATOR XCISE COR (MISCELLANEOUS) ×4 IMPLANT
NS IRRIG 500ML POUR BTL (IV SOLUTION) ×6 IMPLANT
PACK GYN LAPAROSCOPIC (MISCELLANEOUS) ×6 IMPLANT
PAD OB MATERNITY 4.3X12.25 (PERSONAL CARE ITEMS) ×6 IMPLANT
PAD PREP 24X41 OB/GYN DISP (PERSONAL CARE ITEMS) ×6 IMPLANT
POUCH SPECIMEN RETRIEVAL 10MM (ENDOMECHANICALS) IMPLANT
SET CYSTO W/LG BORE CLAMP LF (SET/KITS/TRAYS/PACK) IMPLANT
SET TUBE SMOKE EVAC HIGH FLOW (TUBING) ×6 IMPLANT
SHEARS HARMONIC ACE PLUS 36CM (ENDOMECHANICALS) ×6 IMPLANT
SLEEVE ENDOPATH XCEL 5M (ENDOMECHANICALS) ×6 IMPLANT
SOLUTION ELECTROLUBE (MISCELLANEOUS) ×6 IMPLANT
SPONGE GAUZE 2X2 8PLY STER LF (GAUZE/BANDAGES/DRESSINGS) ×3
SPONGE GAUZE 2X2 8PLY STRL LF (GAUZE/BANDAGES/DRESSINGS) ×15 IMPLANT
STRIP CLOSURE SKIN 1/2X4 (GAUZE/BANDAGES/DRESSINGS) ×5 IMPLANT
STRIP CLOSURE SKIN 1/4X4 (GAUZE/BANDAGES/DRESSINGS) ×5 IMPLANT
SUT VIC AB 0 CT1 36 (SUTURE) ×6 IMPLANT
SUT VIC AB 2-0 UR6 27 (SUTURE) ×6 IMPLANT
SUT VIC AB 4-0 SH 27 (SUTURE) ×4
SUT VIC AB 4-0 SH 27XANBCTRL (SUTURE) ×8 IMPLANT
SUT VICRYL 0 UR6 27IN ABS (SUTURE) ×6 IMPLANT
SWABSTK COMLB BENZOIN TINCTURE (MISCELLANEOUS) ×6 IMPLANT
SYR 10ML LL (SYRINGE) ×6 IMPLANT
SYR 20ML LL LF (SYRINGE) ×6 IMPLANT
TROCAR ENDO BLADELESS 11MM (ENDOMECHANICALS) ×6 IMPLANT
TROCAR XCEL NON-BLD 5MMX100MML (ENDOMECHANICALS) ×6 IMPLANT

## 2019-04-17 NOTE — Transfer of Care (Signed)
Immediate Anesthesia Transfer of Care Note  Patient: Colleen Grimes  Procedure(s) Performed: LAPAROSCOPIC SUPRACERVICAL HYSTERECTOMY (N/A ) LAPAROSCOPIC UNILATERAL SALPINGECTOMY (Right ) HYSTEROSCOPY LAPAROSCOPIC LYSIS OF ADHESIONS  Patient Location: PACU  Anesthesia Type:General  Level of Consciousness: sedated  Airway & Oxygen Therapy: Patient Spontanous Breathing and Patient connected to face mask oxygen  Post-op Assessment: Report given to RN and Post -op Vital signs reviewed and stable  Post vital signs: Reviewed and stable  Last Vitals:  Vitals Value Taken Time  BP 102/60 04/17/19 1122  Temp 36.3 C 04/17/19 1122  Pulse 80 04/17/19 1126  Resp 18 04/17/19 1126  SpO2 100 % 04/17/19 1126  Vitals shown include unvalidated device data.  Last Pain:  Vitals:   04/17/19 1122  TempSrc:   PainSc: 0-No pain         Complications: No apparent anesthesia complications

## 2019-04-17 NOTE — Progress Notes (Signed)
Ready for surgery . [place] and bilateral salpingectomy . Labs reviewed .  All questions answered

## 2019-04-17 NOTE — Discharge Instructions (Addendum)
Supracervical Hysterectomy, Care After This sheet gives you information about how to care for yourself after your procedure. Your health care provider may also give you more specific instructions. If you have problems or questions, contact your health care provider. What can I expect after the procedure? After the procedure, it is common to have some discomfort, tenderness, swelling, and bruising at the surgical area. This normally lasts for about 2 weeks. Follow these instructions at home: Medicines  Take over-the-counter and prescription medicines only as told by your health care provider.  Do not take aspirin. It can cause bleeding. Ask your health care provider when it is safe to use aspirin again.  Do not drive or use heavy machinery while taking prescription pain medicine.  To prevent or treat constipation while you are taking prescription pain medicine, your health care provider may recommend that you: ? Drink enough fluid to keep your urine pale yellow. ? Take over-the-counter or prescription medicines. ? Eat foods that are high in fiber, such as fresh fruits and vegetables, whole grains, and beans. ? Limit foods that are high in fat and processed sugars, such as fried and sweet foods. Activity  Get plenty of rest and sleep.  Try to have someone home with you for 1-2 weeks to help you with everyday chores.  Return to your normal activities as told by your health care provider. Ask your health care provider what activities are safe for you.  Do not lift anything that is heavier than 10 lb (4.5 kg) or the limit that your health care provider tells you until he or she says that it is safe.  Do not douche, use tampons, or have sex for at least 6 weeks or until your health care provider says it is safe to do so. Incision care   Follow instructions from your health care provider about how to take care of your incisions. Make sure you: ? Wash your hands with soap and water before  you change your bandage (dressing). If soap and water are not available, use hand sanitizer. ? Change your dressing as told by your health care provider. ? Leave stitches (sutures), skin glue, or adhesive strips in place. These skin closures may need to stay in place for 2 weeks or longer. If adhesive strip edges start to loosen and curl up, you may trim the loose edges. Do not remove adhesive strips completely unless your health care provider tells you to do that.  Check your incision area every day for signs of infection. Check for: ? Redness, swelling, or pain. ? Fluid or blood. ? Warmth. ? Pus or a bad smell.  Take showers instead of baths for 2-3 weeks or as told by your health care provider. Eating and drinking  Drink enough fluids to keep your urine clear or pale yellow.  Do not drink alcohol until your health care provider says it is okay to do so. General instructions  Monitor your temperature for as long as told by your health care provider.  Keep all follow-up visits as told by your health care provider. This is important. Contact a health care provider if:  You have redness, swelling, or pain around an incision.  You have chills or fever.  You have fluid or blood coming from an incision.  An incision feels warm to the touch.  You have pus or a bad smell coming from an incision.  Your incisions break open.  You feel dizzy or lightheaded.  You have pain or  bleeding when you urinate.  You have diarrhea that does not go away.  You have nausea and vomiting that do not go away.  You have abnormal vaginal discharge.  You have a rash.  You have pain that does not go away when you take medicine. Get help right away if:  You have a fever and your symptoms suddenly get worse.  You have severe abdominal pain.  You have chest pain.  You have shortness of breath.  You faint.  You have pain, swelling, or redness in your leg.  You have heavy vaginal bleeding  with blood clots. Summary  After the procedure, it is common to have some discomfort, tenderness, swelling, and bruising at the surgical site. This normally lasts for about 2 weeks.  Get help right away if you have excessive vaginal bleeding or severe abdominal pain. This information is not intended to replace advice given to you by your health care provider. Make sure you discuss any questions you have with your health care provider. Document Revised: 02/18/2017 Document Reviewed: 06/03/2016 Elsevier Patient Education  2020 [place]   1) The drugs that you were given will stay in your system until tomorrow so for the next 24 hours you should not:  A) Drive an automobile B) Make any legal decisions C) Drink any alcoholic beverage   2) You may resume regular meals tomorrow.  Today it is better to start with liquids and gradually work up to solid foods.  You may eat anything you prefer, but it is better to start with liquids, then soup and crackers, and gradually work up to solid foods.   3) Please notify your doctor immediately if you have any unusual bleeding, trouble breathing, redness and pain at the surgery site, drainage, fever, or pain not relieved by medication.    4) Additional Instructions:        Please contact your physician with any problems or Same Day Surgery at [phone], Monday through Friday 6 am to 4 pm, or [facility] number at [phone].

## 2019-04-17 NOTE — Anesthesia Procedure Notes (Signed)
Procedure Name: Intubation Date/Time: 04/17/2019 7:50 AM Performed by: Allean Found, CRNA Pre-anesthesia Checklist: Patient identified, Patient being monitored, Timeout performed, Emergency Drugs available and Suction available Patient Re-evaluated:Patient Re-evaluated prior to induction Oxygen Delivery Method: Circle system utilized Preoxygenation: Pre-oxygenation with 100% oxygen Induction Type: IV induction Ventilation: Mask ventilation without difficulty Laryngoscope Size: 3 and McGraph Grade View: Grade III Tube type: Oral Tube size: 7.0 mm Number of attempts: 2 Airway Equipment and Method: Stylet Placement Confirmation: ETT inserted through vocal cords under direct vision,  positive ETCO2 and breath sounds checked- equal and bilateral Secured at: 21 cm Tube secured with: Tape Dental Injury: Teeth and Oropharynx as per pre-operative assessment  Difficulty Due To: Difficulty was anticipated and Difficult Airway- due to anterior larynx Comments: Pink square pillow removed for second DL.  Limited neck extension, small mouth.

## 2019-04-17 NOTE — Brief Op Note (Signed)
04/17/2019  11:05 AM  PATIENT:  Colleen Grimes  39 y.o. female  PRE-OPERATIVE DIAGNOSIS:  Menorrhagia, dyspareunia   POST-OPERATIVE DIAGNOSIS:  Menorrhagia, dyspareunia , extensive abdominopelvic adhesions   PROCEDURE:  Laparoscopic supra cervical hysterectomy  Right salpingectomy , extensive abdominal and pelvic adhesiolysis > 50 % of total operating time  Cystoscopy   SURGEON:  Surgeon(s) and Role:    * Pama Roskos, Gwen Her, MD - Primary    * Ward, Honor Loh, MD - Assisting  PHYSICIAN ASSISTANT: PA student  N . Powell  ASSISTANTS: none   ANESTHESIA:   general  EBL:  75 mL IOF 130 cc UO 150 cc  BLOOD ADMINISTERED:none  DRAINS: none   LOCAL MEDICATIONS USED:  MARCAINE     SPECIMEN:  Source of Specimen:  uterus , right fallopian tube   DISPOSITION OF SPECIMEN:  PATHOLOGY  COUNTS:  YES  TOURNIQUET:  * No tourniquets in log *  DICTATION: .Other Dictation: Dictation Number verbal  PLAN OF CARE: Discharge to home after PACU  PATIENT DISPOSITION:  PACU - hemodynamically stable.   Delay start of Pharmacological VTE agent (>24hrs) due to surgical blood loss or risk of bleeding: not applicable

## 2019-04-17 NOTE — Anesthesia Postprocedure Evaluation (Signed)
Anesthesia Post Note  Patient: Colleen Grimes  Procedure(s) Performed: LAPAROSCOPIC SUPRACERVICAL HYSTERECTOMY (N/A ) LAPAROSCOPIC UNILATERAL SALPINGECTOMY (Right ) CYSTOSCOPY LAPAROSCOPIC LYSIS OF ADHESIONS  Patient location during evaluation: PACU Anesthesia Type: General Level of consciousness: awake and alert Pain management: pain level controlled Vital Signs Assessment: post-procedure vital signs reviewed and stable Respiratory status: spontaneous breathing, nonlabored ventilation, respiratory function stable and patient connected to nasal cannula oxygen Cardiovascular status: blood pressure returned to baseline and stable Postop Assessment: no apparent nausea or vomiting Anesthetic complications: no     Last Vitals:  Vitals:   04/17/19 1222 04/17/19 1237  BP: (!) 136/59 105/71  Pulse: 88 87  Resp: 12 14  Temp: 36.6 C 36.6 C  SpO2: 96% 93%    Last Pain:  Vitals:   04/17/19 1237  TempSrc: Temporal  PainSc: 9                  Kay Shippy K Haidyn Kilburg

## 2019-04-17 NOTE — Anesthesia Preprocedure Evaluation (Signed)
Anesthesia Evaluation  Patient identified by MRN, date of birth, ID band Patient awake    Reviewed: Allergy & Precautions, H&P , NPO status , Patient's Chart, lab work & pertinent test results  History of Anesthesia Complications Negative for: history of anesthetic complications  Airway Mallampati: III  TM Distance: <3 FB Neck ROM: full    Dental  (+) Chipped, Poor Dentition, Missing, Partial Lower   Pulmonary shortness of breath and with exertion, asthma , sleep apnea and Continuous Positive Airway Pressure Ventilation , pneumonia, former smoker,           Cardiovascular Exercise Tolerance: Good (-) angina+ DOE  (-) Past MI      Neuro/Psych Seizures -, Well Controlled,  PSYCHIATRIC DISORDERS  Neuromuscular disease    GI/Hepatic Neg liver ROS, GERD  Medicated and Controlled,  Endo/Other  negative endocrine ROSHypothyroidism   Renal/GU      Musculoskeletal   Abdominal   Peds  Hematology negative hematology ROS (+)   Anesthesia Other Findings Past Medical History: No date: Acid reflux No date: Anemia No date: Asthma 03/2017: BRCA negative     Comment:  MyRisk neg No date: Dyspnea     Comment:  occasionally No date: Epilepsy ([place]) 03/2017: Family history of ovarian cancer     Comment:  MyRisk neg No date: Heart murmur     Comment:  tricuspid valve "leaks" No date: Hypothyroidism No date: Pneumonia No date: Seizures ([place])     Comment:  no seizure in several years  Past Surgical History: No date: CESAREAN SECTION 01/02/2016: ESOPHAGOGASTRODUODENOSCOPY (EGD) WITH PROPOFOL; N/A     Comment:  Procedure: ESOPHAGOGASTRODUODENOSCOPY (EGD) WITH               PROPOFOL;  Surgeon: Lucilla Lame, MD;  Location: [facility];  Service: Endoscopy;  Laterality: N/A; No date: TONSILLECTOMY No date: TUBAL LIGATION  BMI    Body Mass Index: 44.23 kg/m      Reproductive/Obstetrics negative OB  ROS                             Anesthesia Physical Anesthesia Plan  ASA: III  Anesthesia Plan: General ETT   Post-op Pain Management:    Induction: Intravenous  PONV Risk Score and Plan: Ondansetron, Dexamethasone, Midazolam and Treatment may vary due to age or medical condition  Airway Management Planned: Oral ETT and Video Laryngoscope Planned  Additional Equipment:   Intra-op Plan:   Post-operative Plan: Extubation in OR  Informed Consent: I have reviewed the patients History and Physical, chart, labs and discussed the procedure including the risks, benefits and alternatives for the proposed anesthesia with the patient or authorized representative who has indicated his/her understanding and acceptance.     Dental Advisory Given  Plan Discussed with: Anesthesiologist, CRNA and Surgeon  Anesthesia Plan Comments: (Patient consented for risks of anesthesia including but not limited to:  - adverse reactions to medications - damage to teeth, lips or other oral mucosa - sore throat or hoarseness - Damage to heart, brain, lungs or loss of life  Patient voiced understanding.)        Anesthesia Quick Evaluation

## 2019-04-17 NOTE — Op Note (Signed)
NAME: Colleen Grimes, Colleen Grimes MEDICAL RECORD [id number] ACCOUNT [id number] DATE OF BIRTH:1980/04/06 FACILITY: [facility] LOCATION: [facility] PHYSICIAN:Lounette Sloan Josefine Class, MD  OPERATIVE REPORT  DATE OF PROCEDURE:  04/17/2019  PREOPERATIVE DIAGNOSES: 1.  Menorrhagia. 2.  Dyspareunia.  POSTOPERATIVE DIAGNOSES: 1.  Menorrhagia. 2.  Dyspareunia. 3.  Extensive abdominopelvic adhesions.  PROCEDURES: 1.  Laparoscopic supracervical hysterectomy. 2.  Right salpingectomy. 3.  Extensive abdominal and pelvic adhesiolysis, greater than 50% of total operating time.  SURGEON:  Laverta Baltimore, MD.  FIRST ASSISTANT:  Larey Days, MD  SECOND ASSISTANT:  PA student, Larrie Kass.  ANESTHESIA:  General endotracheal anesthesia.  INDICATIONS:  This is a 39 year old gravida 4, para 3 patient with a long history of menorrhagia, requiring IV iron transfusions.  The patient also with a significant history of dyspareunia.  The patient is status post 3 cesarean sections.  DESCRIPTION OF PROCEDURE:  After adequate general endotracheal anesthesia, the patient was placed in dorsal supine position with the legs in the [place] stirrups.  The patient's abdomen, perineum and vagina were prepped and draped in normal sterile  fashion.  Timeout was performed.  Straight catheterization of the bladder with a Foley catheter, which was left in place, yielded 100 mL clear urine.  The patient did receive 3 g IV Ancef for surgical prophylaxis.  Gloves were changed.  Attention was  directed to the patient's abdomen where a 5 mm infraumbilical incision was made after injecting with 0.5% Marcaine.  A 5 mm scope was advanced into the abdominal cavity under direct visualization with the Optiview cannula.  The patient's abdomen was  insufflated.  Initial impression revealed multiple omental adhesions to the midabdomen, all the way down to the inferior aspect of the pelvis.  Marked adhesions of the uterus to the bladder were  noted as well.  The left ovary was encased with adhesions  and the fallopian tube could not be identified.  Second port placement was placed in the left lower quadrant 3 cm medial to the left anterior iliac spine.  An 11 mm trocar was advanced under direct visualization.  A third port was placed in the right  lower quadrant, again 3 cm medial to the right anterior iliac spine and a 5 mm trocar was advanced under direct visualization.  The Harmonic scalpel was brought up to the operative field for the next hour and 20 minutes( approximately 55% of total operating time).  Extensive adhesiolysis was  performed with the Harmonic scalpel, removing the omental adhesions, followed by dissecting the left adnexal and the bladder flap from the anterior uterus.  Ultimately, the Harmonic scalpel was used to open the left round ligament and the left broad  ligament.  The uteroovarian ligament was cauterized and transected with the Harmonic scalpel.  Broad ligament was opened, followed by identification of the left uterine artery, which was cauterized and transected with the Harmonic scalpel.  Meticulous  adhesiolysis was performed of the bladder to the anterior uterus.  Left ovary was identified and there were dense adhesions overriding the left ovary with the fallopian tube encased in adhesions, which were not taken down.  Therefore, the left fallopian  tube remained in situ.  Attention was directed to the patient's right side of the uterus where the distal portion of the right fallopian tube was grasped and the mesosalpinx was dissected with the Harmonic scalpel.  The uteroovarian ligament was  cauterized and dissected and transected.  The broad ligament was opened and the right uterine artery was identified, cauterized and transected  with the Harmonic scalpel.  Additional adhesiolysis was performed of the bladder flap to the uterus.   Ultimately at the level of the uterosacral ligaments, the cervix was transected with  the Harmonic scalpel.  The previously placed uterine sound was removed while keeping the single-tooth tenaculum on the anterior cervix.  The Kleppinger cautery was then  advanced into the endocervix and cauterized.  This was aided by a vaginal hand while performing the laparoscopic cautery in the endocervical canal.  The morcellator was then brought up to the operative field, placed through the left lower port site and  the uterus was then morcellated in a standard fashion without difficulty.  The patient's abdomen was irrigated and good hemostasis was noted.  Intraoperative pictures were taken.  Pressure was lowered to 7 mmHg and good hemostasis was noted.  Pressure  was returned to 15 mmHg and the left lower port site was closed with the Carter-Thomason cone.  Two separate sutures were placed with good approximation of the fascial tissues.  Given the amount of adhesions and dissection, I decided to perform a  cystoscopy.  A cystoscope was placed into the bladder and filled with 200 mL of lactated Ringer's.  Good filling was noted.  The normal anterior bubble was noted.  No filling defects were noted.  The patient received 0.5 mL of fluorescein and there was  normal pluming effective of the ureteral ostia bilaterally.  The patient's bladder was drained and the single-tooth tenaculum was removed from the anterior cervix.  Good hemostasis was noted.  The incisions were closed with interrupted 4-0 Vicryl suture  and sterile dressings were applied.  COMPLICATIONS:  There were no complications.  ESTIMATED BLOOD LOSS:  75 mL.  INTRAOPERATIVE FLUIDS:  1300 mL.  URINE OUTPUT:  150 mL.  DISPOSITION:  The patient was taken to recovery room in good condition.  VN/NUANCE  D:04/17/2019 T:04/17/2019 JOB:[id number]

## 2019-04-18 ENCOUNTER — Other Ambulatory Visit: Payer: Medicaid Other

## 2019-04-19 LAB — SURGICAL PATHOLOGY

## 2019-06-12 ENCOUNTER — Ambulatory Visit
Admission: RE | Admit: 2019-06-12 | Discharge: 2019-06-12 | Disposition: A | Discharge status: Home / Self Care | Payer: Medicaid Other | Source: Ambulatory Visit | Attending: Obstetrics and Gynecology | Admitting: Obstetrics and Gynecology

## 2019-06-12 ENCOUNTER — Other Ambulatory Visit: Payer: Self-pay | Admitting: Obstetrics and Gynecology

## 2019-06-12 DIAGNOSIS — Z1231 Encounter for screening mammogram for malignant neoplasm of breast: Secondary | ICD-10-CM | POA: Insufficient documentation

## 2019-06-18 ENCOUNTER — Other Ambulatory Visit: Payer: Self-pay | Admitting: Obstetrics and Gynecology

## 2019-06-18 DIAGNOSIS — R928 Other abnormal and inconclusive findings on diagnostic imaging of breast: Secondary | ICD-10-CM

## 2019-06-18 DIAGNOSIS — N6310 Unspecified lump in the right breast, unspecified quadrant: Secondary | ICD-10-CM

## 2019-06-18 DIAGNOSIS — N6320 Unspecified lump in the left breast, unspecified quadrant: Secondary | ICD-10-CM

## 2019-06-25 ENCOUNTER — Ambulatory Visit
Admission: RE | Admit: 2019-06-25 | Discharge: 2019-06-25 | Disposition: A | Discharge status: Home / Self Care | Payer: Medicaid Other | Source: Ambulatory Visit | Attending: Obstetrics and Gynecology | Admitting: Obstetrics and Gynecology

## 2019-06-25 DIAGNOSIS — R928 Other abnormal and inconclusive findings on diagnostic imaging of breast: Secondary | ICD-10-CM

## 2019-06-25 DIAGNOSIS — N6320 Unspecified lump in the left breast, unspecified quadrant: Secondary | ICD-10-CM

## 2019-06-25 DIAGNOSIS — N6310 Unspecified lump in the right breast, unspecified quadrant: Secondary | ICD-10-CM

## 2019-07-19 ENCOUNTER — Other Ambulatory Visit: Payer: Self-pay

## 2019-07-19 ENCOUNTER — Emergency Department: Payer: Medicaid Other

## 2019-07-19 ENCOUNTER — Emergency Department
Admission: EM | Admit: 2019-07-19 | Discharge: 2019-07-19 | Disposition: A | Discharge status: Home / Self Care | Payer: Medicaid Other | Attending: Student | Admitting: Student

## 2019-07-19 DIAGNOSIS — E039 Hypothyroidism, unspecified: Secondary | ICD-10-CM | POA: Insufficient documentation

## 2019-07-19 DIAGNOSIS — J40 Bronchitis, not specified as acute or chronic: Secondary | ICD-10-CM | POA: Insufficient documentation

## 2019-07-19 DIAGNOSIS — R0981 Nasal congestion: Secondary | ICD-10-CM | POA: Diagnosis present

## 2019-07-19 DIAGNOSIS — J45909 Unspecified asthma, uncomplicated: Secondary | ICD-10-CM | POA: Diagnosis not present

## 2019-07-19 DIAGNOSIS — Z20822 Contact with and (suspected) exposure to covid-19: Secondary | ICD-10-CM | POA: Diagnosis not present

## 2019-07-19 DIAGNOSIS — Z87891 Personal history of nicotine dependence: Secondary | ICD-10-CM | POA: Diagnosis not present

## 2019-07-19 DIAGNOSIS — Z79899 Other long term (current) drug therapy: Secondary | ICD-10-CM | POA: Diagnosis not present

## 2019-07-19 DIAGNOSIS — J22 Unspecified acute lower respiratory infection: Secondary | ICD-10-CM

## 2019-07-19 LAB — BASIC METABOLIC PANEL
Anion gap: 6 (ref 5–15)
BUN: 13 mg/dL (ref 6–20)
CO2: 28 mmol/L (ref 22–32)
Calcium: 9 mg/dL (ref 8.9–10.3)
Chloride: 103 mmol/L (ref 98–111)
Creatinine, Ser: 0.88 mg/dL (ref 0.44–1.00)
GFR calc Af Amer: >60 mL/min (ref >60)
GFR calc non Af Amer: >60 mL/min (ref >60)
Glucose, Bld: 107 mg/dL — ABNORMAL HIGH (ref 70–99)
Potassium: 3.7 mmol/L (ref 3.5–5.1)
Sodium: 137 mmol/L (ref 135–145)

## 2019-07-19 LAB — CBC
HCT: 40.5 % (ref 36.0–46.0)
Hemoglobin: 13.5 g/dL (ref 12.0–15.0)
MCH: 28.5 pg (ref 26.0–34.0)
MCHC: 33.3 g/dL (ref 30.0–36.0)
MCV: 85.6 fL (ref 80.0–100.0)
Platelets: 355 10*3/uL (ref 150–400)
RBC: 4.73 MIL/uL (ref 3.87–5.11)
RDW: 13.6 % (ref 11.5–15.5)
WBC: 7.2 10*3/uL (ref 4.0–10.5)
nRBC: 0 % (ref 0.0–0.2)

## 2019-07-19 LAB — TROPONIN I (HIGH SENSITIVITY): Troponin I (High Sensitivity): 3 ng/L (ref <18)

## 2019-07-19 LAB — RESPIRATORY PANEL BY RT PCR (FLU A&B, COVID)
Influenza A by PCR: NEGATIVE
Influenza B by PCR: NEGATIVE
SARS Coronavirus 2 by RT PCR: NEGATIVE

## 2019-07-19 MED ORDER — PREDNISONE 20 MG PO TABS
40.0000 mg | ORAL_TABLET | Freq: Once | ORAL | Status: AC
  Administered 2019-07-19: 40 mg via ORAL
  Filled 2019-07-19: qty 2

## 2019-07-19 MED ORDER — PREDNISONE 20 MG PO TABS: 40.0000 mg | ORAL_TABLET | Freq: Every day | ORAL | 0 refills | Status: AC

## 2019-07-19 MED ORDER — KETOROLAC TROMETHAMINE 30 MG/ML IJ SOLN
30.0000 mg | Freq: Once | INTRAMUSCULAR | Status: AC
  Administered 2019-07-19: 13:00:00 30 mg via INTRAMUSCULAR
  Filled 2019-07-19: qty 1

## 2019-07-19 MED ORDER — AZITHROMYCIN 250 MG PO TABS: 250.0000 mg | ORAL_TABLET | Freq: Every day | ORAL | 0 refills | Status: AC

## 2019-07-19 MED ORDER — AZITHROMYCIN 500 MG PO TABS
500.0000 mg | ORAL_TABLET | Freq: Once | ORAL | Status: AC
  Administered 2019-07-19: 500 mg via ORAL
  Filled 2019-07-19: qty 1

## 2019-07-19 NOTE — Discharge Instructions (Signed)
Thank you for letting us take care of you in the emergency department today. Your COVID swab was negative today.   Please continue to take any regular, prescribed medications.   New medications we have prescribed:  Azithromycin Prednisone  Please follow up with: Your primary care doctor to review your ER visit and follow up on your symptoms.    Please return to the ER for any new or worsening symptoms.

## 2019-07-19 NOTE — ED Notes (Signed)
Pt reports CP similar to previous CP from "3 years ago". Pt reports she had pneumonia at that time. Pt reports cough "since Monday".  VSS. NAD noted.

## 2019-07-19 NOTE — ED Provider Notes (Signed)
[facility] Emergency Department Provider Note  ____________________________________________   First MD Initiated Contact with Patient 07/19/19 1200     (approximate)  I have reviewed the triage vital signs and the nursing notes.  History  Chief Complaint Chest Pain    HPI Colleen Grimes is a 39 y.o. female past medical history as below, including mild intermittent asthma, who presents to the emergency department for a chest cold.  Patient states she started with head cold-like symptoms at the beginning of the week, primarily nasal congestion and sore throat.  Thinks she might have picked up the symptoms from her daughter who shares similar symptoms.  Daughter was tested for strep throat and COVID earlier in the week and was negative.  Over the last few days she feels like her cold has migrated to her chest area.  Reports chest congestion as well as some chest heaviness.  Describes a nonproductive cough.  No fevers, nausea, vomiting, diarrhea.  Has been using her asthma inhalers with increased frequency as a result of her symptoms w/ mild relief of symptoms.  Denies any history of CAD.  Has not been vaccinated against COVID-19.   Past Medical Hx Past Medical History:  Diagnosis Date  . Acid reflux   . Anemia   . Asthma   . BRCA negative 03/2017   MyRisk neg  . Difficult intubation   . Dyspnea    occasionally  . Epilepsy ([place])   . Family history of ovarian cancer 03/2017   MyRisk neg  . Heart murmur    tricuspid valve "leaks"  . Hypothyroidism   . Pneumonia   . Seizures ([place])    no seizure in several years    Problem List Patient Active Problem List   Diagnosis Date Noted  . Abnormal radionuclide bone scan 03/27/2018  . Elevated alkaline phosphatase level 03/18/2018  . Bone pain 03/09/2018  . Menorrhagia with regular cycle 03/09/2018  . Low vitamin B12 level 03/09/2018  . Iron deficiency anemia 03/01/2018  . Chest pain on exertion 01/26/2018    . Malaise and fatigue 01/26/2018  . Shortness of breath on exertion 01/26/2018  . Snoring 01/26/2018  . Acute respiratory distress 06/24/2017  . Community acquired pneumonia 06/09/2017  . BRCA negative 04/04/2017  . Right foot pain 11/10/2016  . Bursitis of left elbow 07/26/2016  . Fibromyalgia muscle pain 01/26/2016  . Muscle strain 01/26/2016  . Vitamin D deficiency 01/26/2016  . Heartburn   . Acquired hypothyroidism 12/23/2015  . Anxiety state 12/23/2015  . Chronic fatigue, unspecified 12/15/2015  . Mild obesity 12/15/2015  . Multiple joint pain 12/15/2015  . GERD (gastroesophageal reflux disease) 12/09/2015  . Seizures ([place]) 12/09/2015    Past Surgical Hx Past Surgical History:  Procedure Laterality Date  . CESAREAN SECTION    . CYSTOSCOPY  04/17/2019   Procedure: CYSTOSCOPY;  Surgeon: Schermerhorn, Gwen Her, MD;  Location: [facility];  Service: Gynecology;;  . ESOPHAGOGASTRODUODENOSCOPY (EGD) WITH PROPOFOL N/A 01/02/2016   Procedure: ESOPHAGOGASTRODUODENOSCOPY (EGD) WITH PROPOFOL;  Surgeon: Lucilla Lame, MD;  Location: [place];  Service: Endoscopy;  Laterality: N/A;  . LAPAROSCOPIC LYSIS OF ADHESIONS  04/17/2019   Procedure: LAPAROSCOPIC LYSIS OF ADHESIONS;  Surgeon: Ouida Sills, Gwen Her, MD;  Location: [facility];  Service: Gynecology;;  . LAPAROSCOPIC SUPRACERVICAL HYSTERECTOMY N/A 04/17/2019   Procedure: LAPAROSCOPIC SUPRACERVICAL HYSTERECTOMY;  Surgeon: Schermerhorn, Gwen Her, MD;  Location: [facility];  Service: Gynecology;  Laterality: N/A;  . LAPAROSCOPIC UNILATERAL SALPINGECTOMY Right 04/17/2019  Procedure: LAPAROSCOPIC UNILATERAL SALPINGECTOMY;  Surgeon: Schermerhorn, Gwen Her, MD;  Location: [facility];  Service: Gynecology;  Laterality: Right;  . TONSILLECTOMY    . TUBAL LIGATION      Medications Prior to Admission medications   Medication Sig Start Date End Date Taking? Authorizing Provider  albuterol (PROVENTIL HFA;VENTOLIN HFA) 108 (90 Base) MCG/ACT  inhaler Inhale 2 puffs into the lungs every 6 (six) hours as needed for wheezing or shortness of breath.  06/10/17 04/17/19  [provider]  buPROPion (WELLBUTRIN XL) 150 MG 24 hr tablet Take 300 mg by mouth daily. 02/24/19   [provider]  busPIRone (BUSPAR) 10 MG tablet Take 10 mg by mouth 2 (two) times daily.    [provider]  clonazePAM (KLONOPIN) 0.5 MG tablet Take 0.5 mg by mouth daily as needed for anxiety. 06/07/18   [provider]  ergocalciferol (VITAMIN D2) 1.25 MG (50000 UT) capsule Take 50,000 Units by mouth once a week.    [provider]  escitalopram (LEXAPRO) 20 MG tablet Take 20 mg by mouth daily.  12/04/15 04/05/19  [provider]  ferrous sulfate 325 (65 FE) MG tablet Take 325 mg by mouth daily with breakfast.    [provider]  Ipratropium-Albuterol (COMBIVENT) 20-100 MCG/ACT AERS respimat Inhale 1 puff into the lungs every 6 (six) hours as needed for wheezing or shortness of breath.  01/20/18   [provider]  Iron-Vitamin C 65-125 MG TABS 1 tablet daily x 1 week, then increase to 1 tablet twice a day. 03/01/18   Karen Kitchens, NP  levothyroxine (SYNTHROID) 50 MCG tablet Take 50 mcg by mouth daily before breakfast.    [provider]  omeprazole (PRILOSEC) 40 MG capsule Take 40 mg by mouth daily.     [provider]  oxyCODONE-acetaminophen (PERCOCET) 5-325 MG tablet Take 1 tablet by mouth every 6 (six) hours as needed for severe pain. Patient not taking: Reported on 04/05/2019 01/03/19 01/03/20  Nance Pear, MD  vitamin B-12 (CYANOCOBALAMIN) 1000 MCG tablet Take 1,000 mcg by mouth daily.    [provider]    Allergies Dilantin [phenytoin sodium extended]  Family Hx Family History  Problem Relation Age of Onset  . Ovarian cancer Mother   . Vaginal cancer Mother   . Cancer Mother   . Breast cancer Paternal Aunt   . Diabetes Paternal Grandmother   . Diabetes  Paternal Grandfather   . Breast cancer Paternal Aunt   . Colon cancer Neg Hx   . Heart disease Neg Hx     Social Hx Social History   Tobacco Use  . Smoking status: Former Smoker    Quit date: 04/11/2010    Years since quitting: 9.2  . Smokeless tobacco: Never Used  Substance Use Topics  . Alcohol use: No  . Drug use: No     Review of Systems  Constitutional: Negative for fever. Negative for chills. Eyes: Negative for visual changes. ENT: Negative for sore throat. Cardiovascular: Negative for chest pain. Respiratory: Negative for shortness of breath. + chest cold Gastrointestinal: Negative for nausea. Negative for vomiting.  Genitourinary: Negative for dysuria. Musculoskeletal: Negative for leg swelling. Skin: Negative for rash. Neurological: Negative for headaches.   Physical Exam  Vital Signs: ED Triage Vitals  Enc Vitals Group     BP 07/19/19 1029 139/71     Pulse Rate 07/19/19 1029 80     Resp 07/19/19 1029 16     Temp 07/19/19 1029 (!)  97.5 F (36.4 C)     Temp Source 07/19/19 1029 Oral     SpO2 07/19/19 1029 98 %     Weight 07/19/19 1030 270 lb (122.5 kg)     Height 07/19/19 1030 5' 6"  (1.676 m)     Head Circumference --      Peak Flow --      Pain Score 07/19/19 1030 4     Pain Loc --      Pain Edu? --      Excl. in [place]? --     Constitutional: Alert and oriented. Well appearing. NAD.  Head: Normocephalic. Atraumatic. Eyes: Conjunctivae clear. Sclera anicteric. Pupils equal and symmetric. Nose: No masses or lesions.  Mild nasal and sinus congestion.  No rhinorrhea.  Sinuses nontender to palpation. Mouth/Throat: Wearing mask.  No erythema the posterior oropharynx.  No tonsillar hypertrophy or exudates. Neck: No stridor. Trachea midline.  Cardiovascular: Normal rate, regular rhythm. Extremities well perfused. Respiratory: Normal respiratory effort.  Lungs CTAB.  No wheezing. Gastrointestinal: Soft. Non-distended. Non-tender.  Genitourinary:  Deferred. Musculoskeletal: No lower extremity edema. No deformities. Neurologic:  Normal speech and language. No gross focal or lateralizing neurologic deficits are appreciated.  Skin: Skin is warm, dry and intact. No rash noted. Psychiatric: Mood and affect are appropriate for situation.  EKG  Personally reviewed and interpreted by myself.   Date: 07/19/19 Time: 1033 Rate: 76 Rhythm: sinus Axis: normal Intervals: WNL No acute ischemic changes No STEMI    Radiology  Personally reviewed available imaging myself.   CXR - IMPRESSION:  No active cardiopulmonary disease.    Procedures  Procedure(s) performed (including critical care):  Procedures   Initial Impression / Assessment and Plan / MDM / ED Course  39 y.o. female who presents to the ED for nonproductive cough, nasal congestion, sore throat, "chest cold"  Ddx: URI, mild asthma exacerbation, COVID, other viral process, bronchitis, pneumonia  Will plan for labs, EKG, CXR  EKG without acute ischemic changes.  High-sensitivity troponin negative.  Given symptoms have been ongoing for a week do not feel delta troponin is necessary.  CXR negative for any consolidation.  Will send COVID swab, plan to treat for mild asthma exacerbation with course of azithromycin and prednisone.  Patient voices understanding and is comfortable with this plan and discharge.   _______________________________   As part of my medical decision making I have reviewed available labs, radiology tests, reviewed old records/performed chart review.    Final Clinical Impression(s) / ED Diagnosis  Final diagnoses:  Chest cold       Note:  This document was prepared using Dragon voice recognition software and may include unintentional dictation errors.   Lilia Pro., MD 07/20/19 1020

## 2019-07-19 NOTE — ED Triage Notes (Signed)
Pt here with CP for 2 days with a cough and spitting up. Pt c/o of 4/10 pain.

## 2019-07-19 NOTE — ED Notes (Signed)
Pt gave verbal consent for discharge.

## 2019-09-08 ENCOUNTER — Emergency Department
Admission: EM | Admit: 2019-09-08 | Discharge: 2019-09-08 | Disposition: A | Discharge status: Home / Self Care | Payer: Medicaid Other | Attending: Emergency Medicine | Admitting: Emergency Medicine

## 2019-09-08 ENCOUNTER — Emergency Department: Payer: Medicaid Other

## 2019-09-08 ENCOUNTER — Other Ambulatory Visit: Payer: Self-pay

## 2019-09-08 DIAGNOSIS — R1310 Dysphagia, unspecified: Secondary | ICD-10-CM | POA: Diagnosis not present

## 2019-09-08 DIAGNOSIS — K219 Gastro-esophageal reflux disease without esophagitis: Secondary | ICD-10-CM | POA: Diagnosis not present

## 2019-09-08 DIAGNOSIS — R0789 Other chest pain: Secondary | ICD-10-CM | POA: Diagnosis present

## 2019-09-08 DIAGNOSIS — R110 Nausea: Secondary | ICD-10-CM | POA: Insufficient documentation

## 2019-09-08 DIAGNOSIS — E039 Hypothyroidism, unspecified: Secondary | ICD-10-CM | POA: Diagnosis not present

## 2019-09-08 DIAGNOSIS — Z79899 Other long term (current) drug therapy: Secondary | ICD-10-CM | POA: Diagnosis not present

## 2019-09-08 LAB — BASIC METABOLIC PANEL
Anion gap: 6 (ref 5–15)
BUN: 12 mg/dL (ref 6–20)
CO2: 29 mmol/L (ref 22–32)
Calcium: 9 mg/dL (ref 8.9–10.3)
Chloride: 102 mmol/L (ref 98–111)
Creatinine, Ser: 0.91 mg/dL (ref 0.44–1.00)
GFR calc Af Amer: >60 mL/min (ref >60)
GFR calc non Af Amer: >60 mL/min (ref >60)
Glucose, Bld: 116 mg/dL — ABNORMAL HIGH (ref 70–99)
Potassium: 3.6 mmol/L (ref 3.5–5.1)
Sodium: 137 mmol/L (ref 135–145)

## 2019-09-08 LAB — CBC
HCT: 38.9 % (ref 36.0–46.0)
Hemoglobin: 13.3 g/dL (ref 12.0–15.0)
MCH: 28.8 pg (ref 26.0–34.0)
MCHC: 34.2 g/dL (ref 30.0–36.0)
MCV: 84.2 fL (ref 80.0–100.0)
Platelets: 315 10*3/uL (ref 150–400)
RBC: 4.62 MIL/uL (ref 3.87–5.11)
RDW: 13.7 % (ref 11.5–15.5)
WBC: 7.1 10*3/uL (ref 4.0–10.5)
nRBC: 0 % (ref 0.0–0.2)

## 2019-09-08 LAB — TROPONIN I (HIGH SENSITIVITY)
Troponin I (High Sensitivity): 3 ng/L (ref <18)
Troponin I (High Sensitivity): 3 ng/L (ref <18)

## 2019-09-08 NOTE — Discharge Instructions (Signed)
Please follow-up with your primary care doctor.  I do recommend you continue your medications for acid reflux treatment, as that may be contributing to your neck discomfort.   Get help right away if: Your neck becomes stiff. You drool or are unable to swallow liquids. You cannot drink or take medicines without vomiting. You have severe pain that does not go away, even after you take medicine. You have trouble breathing, and it is not caused by a stuffy nose. You have new pain and swelling in your joints such as the knees, ankles, wrists, or elbows.

## 2019-09-08 NOTE — ED Triage Notes (Signed)
Pt arrives to ER A&O, ambulatory. C/o central CP.  Pain began Tuesday night. States "it hurts in my trachea area." hx GERD. No distress noted.

## 2019-09-08 NOTE — ED Provider Notes (Signed)
[facility] Emergency Department Provider Note   ____________________________________________   First MD Initiated Contact with Patient 09/08/19 1622     (approximate)  I have reviewed the triage vital signs and the nursing notes.   HISTORY  Chief Complaint Chest Pain    HPI Colleen Grimes is a 39 y.o. female for evaluation of having discomfort especially with swallowing in the front of her neck for about 4 to 5 days  Patient reports that she has been experiencing discomfort with swallowing, nausea pain and her neck.  She reports is not really in her throat but it feels like a sore throat but lower.  Discomfort with swallowing.  No voice changes no shortness of breath.  No swelling.  History of low thyroid, recently increased her thyroid dose in the care of her doctor  No redness or swelling.  Denies chest pain or trouble breathing.  Reports it feels more like just a rale or soreness in her lower throat.  Does have a long history of acid reflux as well  Patient reports she is planned to travel to the beach, try to get in with her doctor on Friday but they did not have an appointment.  Symptoms have persisted   Past Medical History:  Diagnosis Date   Acid reflux    Anemia    Asthma    BRCA negative 03/2017   MyRisk neg   Difficult intubation    Dyspnea    occasionally   Epilepsy ([place])    Family history of ovarian cancer 03/2017   MyRisk neg   Heart murmur    tricuspid valve "leaks"   Hypothyroidism    Pneumonia    Seizures ([place])    no seizure in several years    Patient Active Problem List   Diagnosis Date Noted   Abnormal radionuclide bone scan 03/27/2018   Elevated alkaline phosphatase level 03/18/2018   Bone pain 03/09/2018   Menorrhagia with regular cycle 03/09/2018   Low vitamin B12 level 03/09/2018   Iron deficiency anemia 03/01/2018   Chest pain on exertion 01/26/2018   Malaise and fatigue 01/26/2018    Shortness of breath on exertion 01/26/2018   Snoring 01/26/2018   Acute respiratory distress 06/24/2017   Community acquired pneumonia 06/09/2017   BRCA negative 04/04/2017   Right foot pain 11/10/2016   Bursitis of left elbow 07/26/2016   Fibromyalgia muscle pain 01/26/2016   Muscle strain 01/26/2016   Vitamin D deficiency 01/26/2016   Heartburn    Acquired hypothyroidism 12/23/2015   Anxiety state 12/23/2015   Chronic fatigue, unspecified 12/15/2015   Mild obesity 12/15/2015   Multiple joint pain 12/15/2015   GERD (gastroesophageal reflux disease) 12/09/2015   Seizures ([place]) 12/09/2015    Past Surgical History:  Procedure Laterality Date   CESAREAN SECTION     CYSTOSCOPY  04/17/2019   Procedure: CYSTOSCOPY;  Surgeon: Schermerhorn, Gwen Her, MD;  Location: [facility];  Service: Gynecology;;   ESOPHAGOGASTRODUODENOSCOPY (EGD) WITH PROPOFOL N/A 01/02/2016   Procedure: ESOPHAGOGASTRODUODENOSCOPY (EGD) WITH PROPOFOL;  Surgeon: Lucilla Lame, MD;  Location: [place];  Service: Endoscopy;  Laterality: N/A;   LAPAROSCOPIC LYSIS OF ADHESIONS  04/17/2019   Procedure: LAPAROSCOPIC LYSIS OF ADHESIONS;  Surgeon: Schermerhorn, Gwen Her, MD;  Location: [facility];  Service: Gynecology;;   LAPAROSCOPIC SUPRACERVICAL HYSTERECTOMY N/A 04/17/2019   Procedure: LAPAROSCOPIC SUPRACERVICAL HYSTERECTOMY;  Surgeon: Ouida Sills Gwen Her, MD;  Location: [facility];  Service: Gynecology;  Laterality: N/A;   LAPAROSCOPIC UNILATERAL SALPINGECTOMY  Right 04/17/2019   Procedure: LAPAROSCOPIC UNILATERAL SALPINGECTOMY;  Surgeon: Schermerhorn, Gwen Her, MD;  Location: [facility];  Service: Gynecology;  Laterality: Right;   TONSILLECTOMY     TUBAL LIGATION      Prior to Admission medications   Medication Sig Start Date End Date Taking? Authorizing Provider  albuterol (PROVENTIL HFA;VENTOLIN HFA) 108 (90 Base) MCG/ACT inhaler Inhale 2 puffs into the lungs every 6 (six) hours as needed for  wheezing or shortness of breath.  06/10/17 04/17/19  [provider]  buPROPion (WELLBUTRIN XL) 150 MG 24 hr tablet Take 300 mg by mouth daily. 02/24/19   [provider]  busPIRone (BUSPAR) 10 MG tablet Take 10 mg by mouth 2 (two) times daily.    [provider]  clonazePAM (KLONOPIN) 0.5 MG tablet Take 0.5 mg by mouth daily as needed for anxiety. 06/07/18   [provider]  ergocalciferol (VITAMIN D2) 1.25 MG (50000 UT) capsule Take 50,000 Units by mouth once a week.    [provider]  escitalopram (LEXAPRO) 20 MG tablet Take 20 mg by mouth daily.  12/04/15 04/05/19  [provider]  ferrous sulfate 325 (65 FE) MG tablet Take 325 mg by mouth daily with breakfast.    [provider]  Ipratropium-Albuterol (COMBIVENT) 20-100 MCG/ACT AERS respimat Inhale 1 puff into the lungs every 6 (six) hours as needed for wheezing or shortness of breath.  01/20/18   [provider]  Iron-Vitamin C 65-125 MG TABS 1 tablet daily x 1 week, then increase to 1 tablet twice a day. 03/01/18   Karen Kitchens, NP  levothyroxine (SYNTHROID) 50 MCG tablet Take 50 mcg by mouth daily before breakfast.    [provider]  omeprazole (PRILOSEC) 40 MG capsule Take 40 mg by mouth daily.     [provider]  oxyCODONE-acetaminophen (PERCOCET) 5-325 MG tablet Take 1 tablet by mouth every 6 (six) hours as needed for severe pain. Patient not taking: Reported on 04/05/2019 01/03/19 01/03/20  Nance Pear, MD  vitamin B-12 (CYANOCOBALAMIN) 1000 MCG tablet Take 1,000 mcg by mouth daily.    [provider]    Allergies Dilantin [phenytoin sodium extended]  Family History  Problem Relation Age of Onset   Ovarian cancer Mother    Vaginal cancer Mother    Cancer Mother    Breast cancer Paternal Aunt    Diabetes Paternal Grandmother    Diabetes Paternal Grandfather    Breast cancer Paternal Aunt    Colon cancer Neg Hx    Heart  disease Neg Hx     Social History Social History   Tobacco Use   Smoking status: Former Smoker    Quit date: 04/11/2010    Years since quitting: 9.4   Smokeless tobacco: Never Used  Vaping Use   Vaping Use: Never used  Substance Use Topics   Alcohol use: No   Drug use: No    Review of Systems Constitutional: No fever/chills Eyes: No visual changes. ENT: No sore throat.  Reports that it feels like a rawness in the front of her neck when she swallows especially Cardiovascular: Denies chest pain.  No radiation. Respiratory: Denies shortness of breath. Gastrointestinal: No abdominal pain.   Skin: Negative for rash.  No neck pain.  No neck stiffness. Neurological: Negative for focal weakness or numbness.    ____________________________________________   PHYSICAL EXAM:  VITAL SIGNS: ED Triage Vitals  Enc Vitals Group     BP 09/08/19 1351 115/65  Pulse Rate 09/08/19 1351 78     Resp 09/08/19 1351 18     Temp 09/08/19 1351 98.4 F (36.9 C)     Temp Source 09/08/19 1351 Oral     SpO2 09/08/19 1351 96 %     Weight 09/08/19 1350 270 lb (122.5 kg)     Height 09/08/19 1350 5' 6" (1.676 m)     Head Circumference --      Peak Flow --      Pain Score 09/08/19 1350 6     Pain Loc --      Pain Edu? --      Excl. in [place]? --     Constitutional: Alert and oriented. Well appearing and in no acute distress. Eyes: Conjunctivae are normal. Head: Atraumatic. Nose: No congestion/rhinnorhea. Mouth/Throat: Mucous membranes are moist.  Previous tonsillectomy.  Posterior oropharynx appears normal.  No injection.  Tongue appears normal. Neck: No stridor.  No noted pain discomfort or swelling over the thyroid.  No anterior neck tenderness.  Reports when she swallows that it feels raw.  Swallowing her secretions without any difficulty.  Clear voice. Cardiovascular: Normal rate, regular rhythm. Grossly normal heart sounds.  Good peripheral circulation. Respiratory: Normal respiratory  effort.  No retractions. Lungs CTAB. Gastrointestinal: Soft and nontender. No distention. Musculoskeletal: No lower extremity tenderness nor edema. Neurologic:  Normal speech and language. No gross focal neurologic deficits are appreciated.  Skin:  Skin is warm, dry and intact. No rash noted. Psychiatric: Mood and affect are normal. Speech and behavior are normal.  ____________________________________________   LABS (all labs ordered are listed, but only abnormal results are displayed)  Labs Reviewed  BASIC METABOLIC PANEL - Abnormal; Notable for the following components:      Result Value   Glucose, Bld 116 (*)    All other components within normal limits  CBC  POC URINE PREG, ED  TROPONIN I (HIGH SENSITIVITY)  TROPONIN I (HIGH SENSITIVITY)   ____________________________________________  EKG  Reviewed inter by me at 1350 Heart rate 75 QRS 70 QTc 420 Normal sinus rhythm, no evidence acute ischemia ____________________________________________  RADIOLOGY  Chest x-ray reviewed normal ____________________________________________   PROCEDURES  Procedure(s) performed: None  Procedures  Critical Care performed: No  ____________________________________________   INITIAL IMPRESSION / ASSESSMENT AND PLAN / ED COURSE  Pertinent labs & imaging results that were available during my care of the patient were reviewed by me and considered in my medical decision making (see chart for details).   Anterior neck discomfort worse with swallowing.  Seem to have some element of discomfort, but very reassuring exam.  We discussed that further evaluation could involve obtaining a CT scan or pharyngoscopy laryngoscopy, but given the patient's goals of care she advises that she wanted to make sure that things seemed reassuring which at this point I think her exam does.  She does not wish to undergo CT scan at this time as her symptoms she reports that mild, and possibly related to acid  reflux.  I think that continuation of her reflux treatment, careful return precautions is appropriate.  Denies that this represents chest pain.  No airway involvement.  No swelling.  No fevers or infectious symptoms.  Thyroid does not appear to be tender, her vital signs are normal.  Return precautions and treatment recommendations and follow-up discussed with the patient who is agreeable with the plan.  Low risk HEAR score. Trop normal. EKG reassuring.        ____________________________________________  FINAL CLINICAL IMPRESSION(S) / ED DIAGNOSES  Final diagnoses:  Pain or burning when swallowing        Note:  This document was prepared using Dragon voice recognition software and may include unintentional dictation errors       Delman Kitten, MD 09/08/19 1727

## 2019-09-11 IMAGING — NM NM BONE WHOLE BODY
2 series · 8 of 8 positions shown · non-contrast
Comparison: Right foot radiographs dated 11/25/2013

CLINICAL DATA: Bone pain. Elevated alkaline phosphatase. Low back
pain. Right heel pain.

EXAM:
NUCLEAR MEDICINE WHOLE BODY BONE SCAN
TECHNIQUE: Whole body anterior and posterior images were obtained approximately
3 hours after intravenous injection of radiopharmaceutical.
RADIOPHARMACEUTICALS:  23.7 mCi Sechnetium-DDm MDP IV

[Series 1000: statics · 2.40mm/px · 3 acquisitions, 6 frames shown]
[im 1/3]
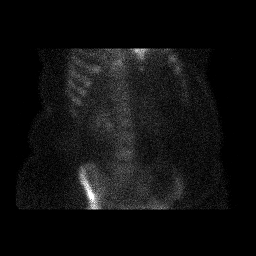
[im 1/3]
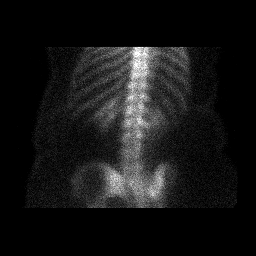
[im 2/3]
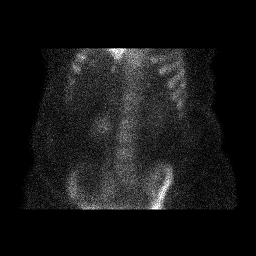
[im 2/3]
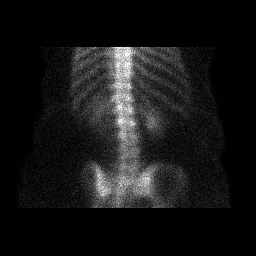
[im 3/3]
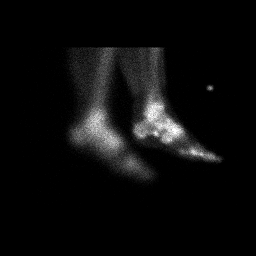
[im 3/3]
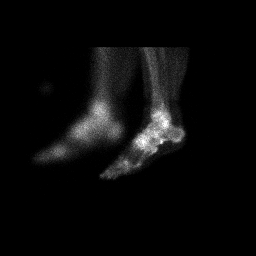

[Series 1000: 3 hr wholebody · 2.40mm/px · 2 of 2 frames shown]
[frame 1/2]
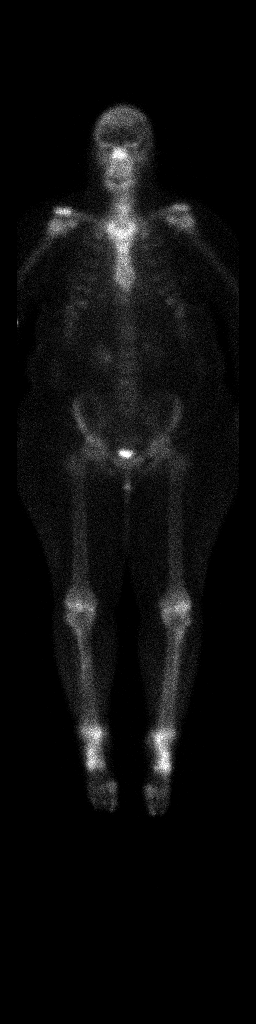
[frame 2/2]
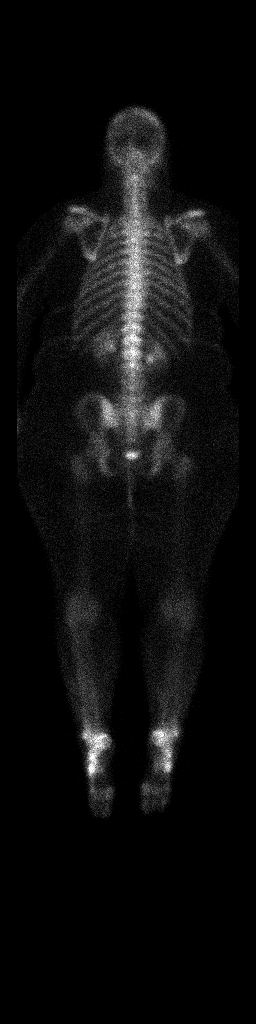

[8 of 8 positions shown; findings below may reference images not displayed]

FINDINGS: Patient has extensive abnormal activity in both feet and both
ankles. The remainder of the skeleton is normal. Specifically, no
abnormal activity in the lumbar spine or pelvis.
IMPRESSION: Abnormal bone scan with multiple areas of abnormal activity in both
feet and both ankles. Recommend correlation with plain radiographs
for further evaluation.

Otherwise, normal bones.

## 2019-09-17 IMAGING — CR DG FOOT COMPLETE 3+V*R*
3 series · 3 of 3 positions shown · non-contrast
Comparison: Bone scan 03/21/2018.  Right foot series 11/10/2016.

CLINICAL DATA: Foot and ankle pain.  Abnormal bone scan.

EXAM:
RIGHT FOOT COMPLETE - 3+ VIEW

[foot ap]
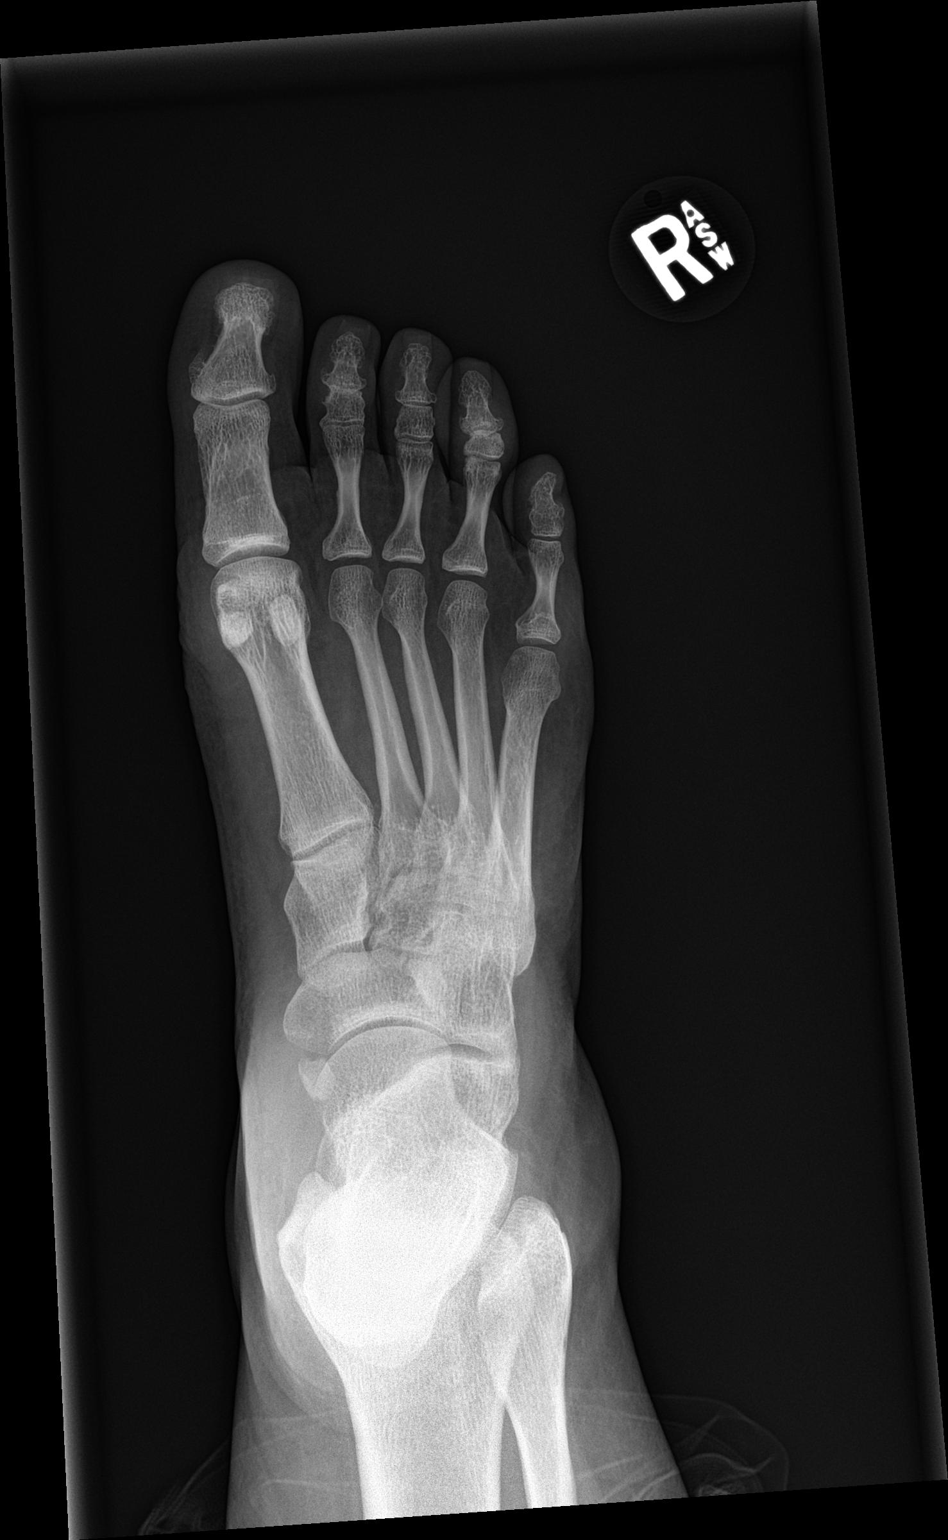

[foot obl]
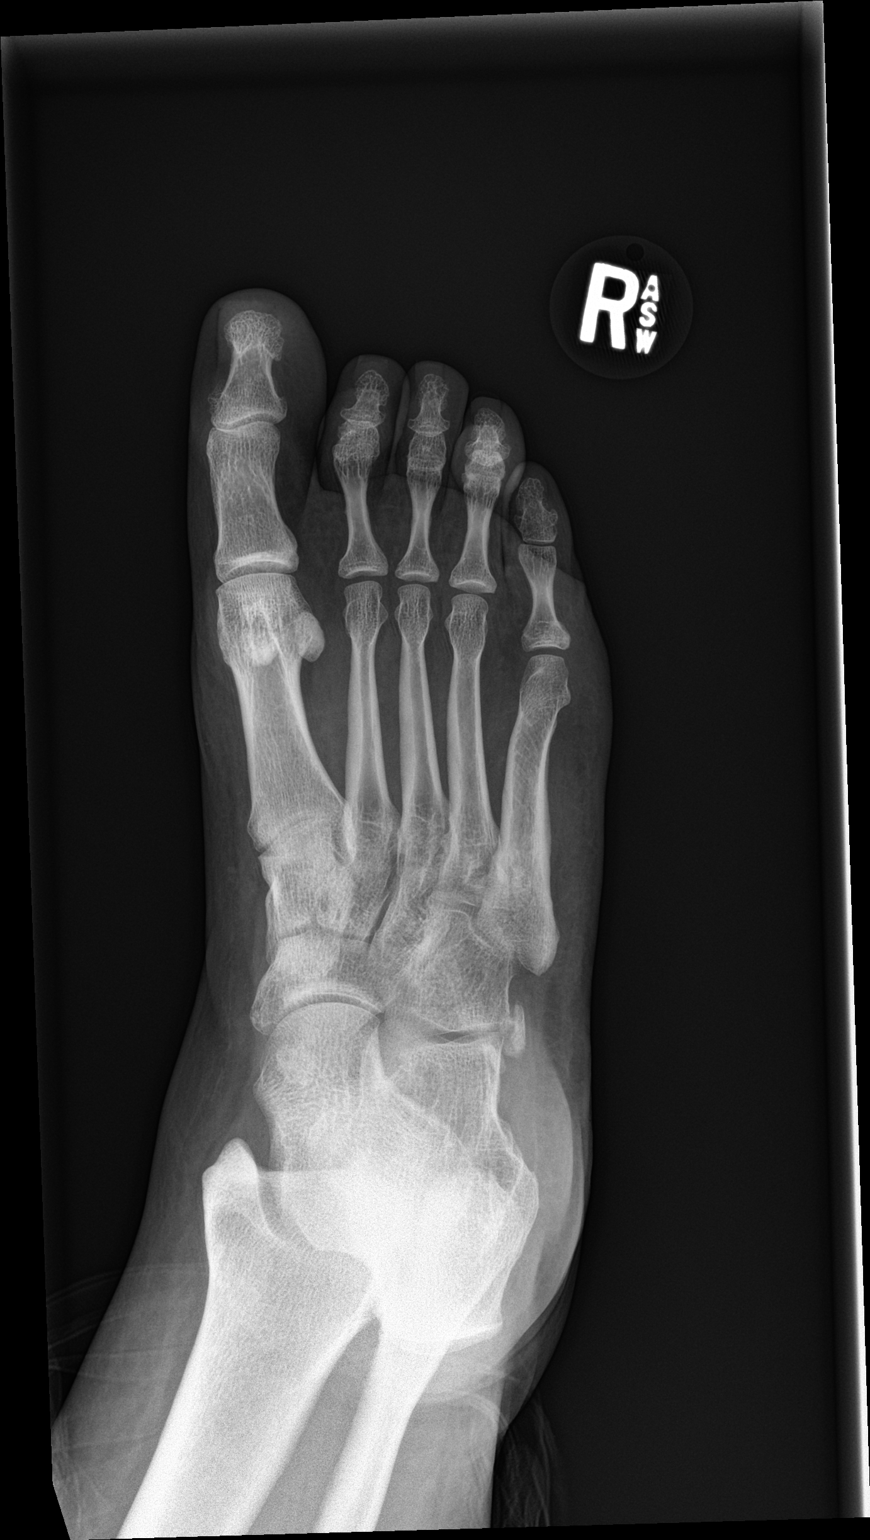

[foot lat]
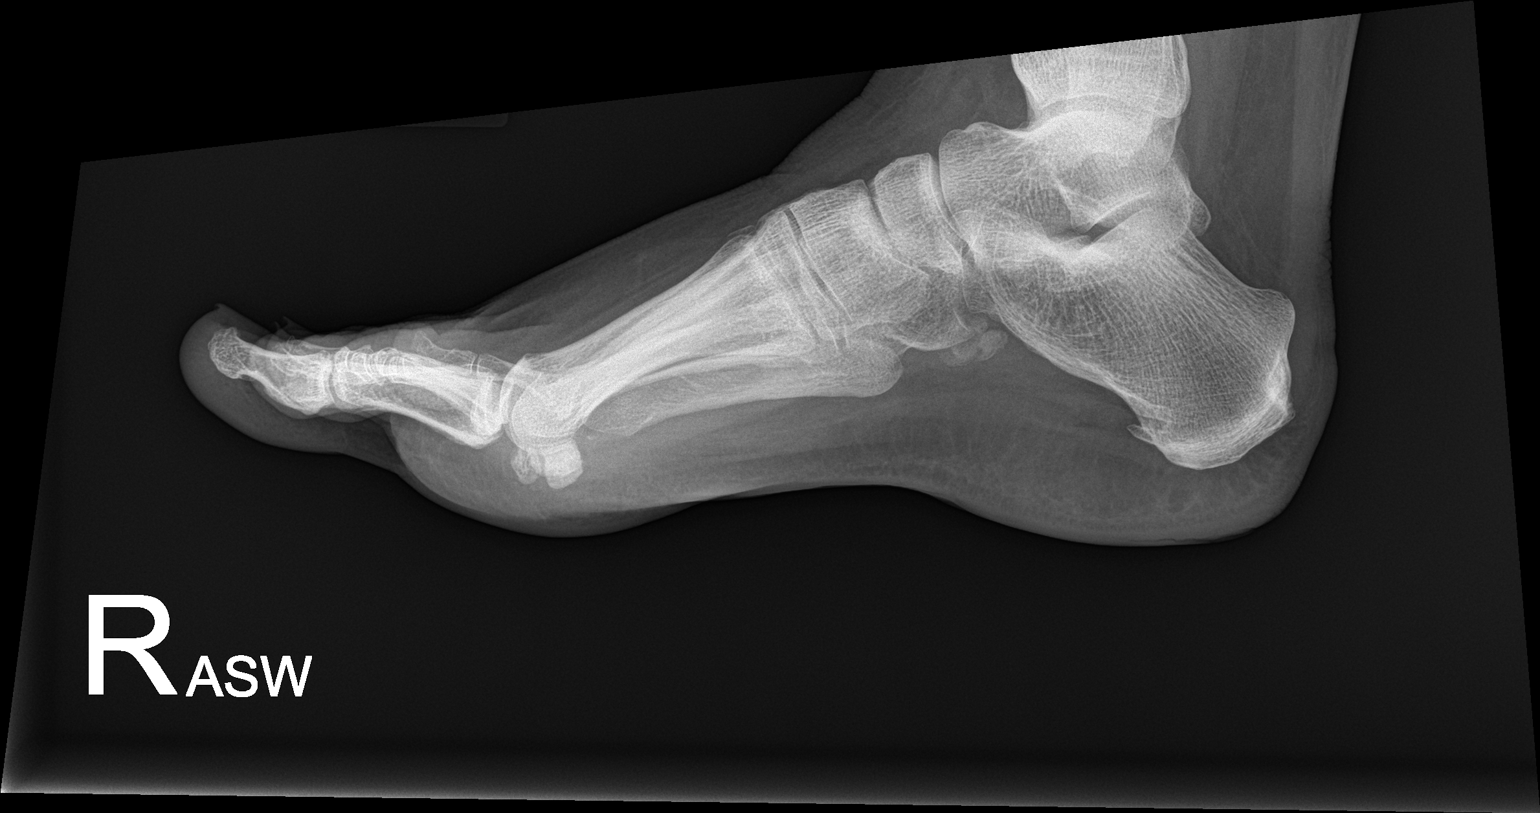

[3 of 3 positions shown; findings below may reference images not displayed]

FINDINGS: No acute soft tissue bony abnormality identified. Tiny old fracture
fragment noted along the dorsum of the foot possibly over the first
metatarsal, unchanged. No significant arthropathy noted. No
abnormality noted to account for abnormal bone scan. No radiopaque
foreign bodies.
IMPRESSION: No acute or focal bony abnormality noted to account for abnormal
bone scan activity.

## 2019-09-19 ENCOUNTER — Encounter: Payer: Self-pay | Admitting: Emergency Medicine

## 2019-09-19 ENCOUNTER — Ambulatory Visit
Admission: RE | Admit: 2019-09-19 | Discharge: 2019-09-19 | Disposition: A | Discharge status: Home / Self Care | Payer: Medicaid Other | Source: Home / Self Care | Attending: Family Medicine | Admitting: Family Medicine

## 2019-09-19 ENCOUNTER — Other Ambulatory Visit: Payer: Self-pay

## 2019-09-19 ENCOUNTER — Ambulatory Visit
Admission: EM | Admit: 2019-09-19 | Discharge: 2019-09-19 | Disposition: A | Discharge status: Home / Self Care | Payer: Medicaid Other | Attending: Family Medicine | Admitting: Family Medicine

## 2019-09-19 ENCOUNTER — Telehealth: Payer: Self-pay | Admitting: Family Medicine

## 2019-09-19 ENCOUNTER — Ambulatory Visit
Admission: RE | Admit: 2019-09-19 | Discharge: 2019-09-19 | Disposition: A | Discharge status: Home / Self Care | Payer: Medicaid Other | Attending: Family Medicine | Admitting: Family Medicine

## 2019-09-19 DIAGNOSIS — M542 Cervicalgia: Secondary | ICD-10-CM

## 2019-09-19 MED ORDER — MELOXICAM 15 MG PO TABS: 15.0000 mg | ORAL_TABLET | Freq: Every day | ORAL | 0 refills | Status: DC | PRN

## 2019-09-19 NOTE — Telephone Encounter (Signed)
Informed about [place] results. Mobic as needed for pain. Follow-up with PCP to discuss ENT referral versus watchful waiting versus further imaging.  [facility]

## 2019-09-19 NOTE — ED Triage Notes (Signed)
Called patient's insurance co for prior auth for US neck/thyroid. Spoke with Benay Pike, auth # [id number]. Called Almyra Free at [facility] [place]. Patient to be worked in.

## 2019-09-19 NOTE — ED Triage Notes (Signed)
Patient states her thyroid has been swollen x 2 weeks. She states her PCP increased her thyroid medication several weeks ago. She states she has a lump on the left side of her neck that is very painful.

## 2019-09-20 NOTE — ED Provider Notes (Addendum)
[facility]    CSN: [id number] Arrival date & time: 09/19/19  1619      History   Chief Complaint Chief Complaint  Patient presents with  . Neck Pain    lump on left side of neck   HPI   39 year old female presents with the above complaint.  Patient reports that she developed anterior neck pain and difficulty swallowing approximately 2 weeks ago.  She was seen in the ER on 6/19.  Had a reassuring exam and no further work-up was done after discussion with the patient.  Patient states that this has improved but now she has a focal area of pain on the left side of her neck which seems to be above the thyroid.  She reports difficulty swallowing at times.  She reports hoarseness.  This particular pain has been troublesome for approximately 3 days.  Worse with certain movements and also worse with palpation.  She rates her pain as 9/10 in severity.  No relieving factors.  No other complaints.  Past Medical History:  Diagnosis Date  . Acid reflux   . Anemia   . Asthma   . BRCA negative 03/2017   MyRisk neg  . Difficult intubation   . Dyspnea    occasionally  . Epilepsy ([place])   . Family history of ovarian cancer 03/2017   MyRisk neg  . Heart murmur    tricuspid valve "leaks"  . Hypothyroidism   . Pneumonia   . Seizures ([place])    no seizure in several years    Patient Active Problem List   Diagnosis Date Noted  . Abnormal radionuclide bone scan 03/27/2018  . Elevated alkaline phosphatase level 03/18/2018  . Bone pain 03/09/2018  . Menorrhagia with regular cycle 03/09/2018  . Low vitamin B12 level 03/09/2018  . Iron deficiency anemia 03/01/2018  . Chest pain on exertion 01/26/2018  . Malaise and fatigue 01/26/2018  . Shortness of breath on exertion 01/26/2018  . Snoring 01/26/2018  . Acute respiratory distress 06/24/2017  . Community acquired pneumonia 06/09/2017  . BRCA negative 04/04/2017  . Right foot pain 11/10/2016  . Bursitis of left elbow 07/26/2016    . Fibromyalgia muscle pain 01/26/2016  . Muscle strain 01/26/2016  . Vitamin D deficiency 01/26/2016  . Heartburn   . Acquired hypothyroidism 12/23/2015  . Anxiety state 12/23/2015  . Chronic fatigue, unspecified 12/15/2015  . Mild obesity 12/15/2015  . Multiple joint pain 12/15/2015  . GERD (gastroesophageal reflux disease) 12/09/2015  . Seizures ([place]) 12/09/2015    Past Surgical History:  Procedure Laterality Date  . CESAREAN SECTION    . CYSTOSCOPY  04/17/2019   Procedure: CYSTOSCOPY;  Surgeon: Schermerhorn, Gwen Her, MD;  Location: [facility];  Service: Gynecology;;  . ESOPHAGOGASTRODUODENOSCOPY (EGD) WITH PROPOFOL N/A 01/02/2016   Procedure: ESOPHAGOGASTRODUODENOSCOPY (EGD) WITH PROPOFOL;  Surgeon: Lucilla Lame, MD;  Location: [place];  Service: Endoscopy;  Laterality: N/A;  . LAPAROSCOPIC LYSIS OF ADHESIONS  04/17/2019   Procedure: LAPAROSCOPIC LYSIS OF ADHESIONS;  Surgeon: Ouida Sills, Gwen Her, MD;  Location: [facility];  Service: Gynecology;;  . LAPAROSCOPIC SUPRACERVICAL HYSTERECTOMY N/A 04/17/2019   Procedure: LAPAROSCOPIC SUPRACERVICAL HYSTERECTOMY;  Surgeon: Schermerhorn, Gwen Her, MD;  Location: [facility];  Service: Gynecology;  Laterality: N/A;  . LAPAROSCOPIC UNILATERAL SALPINGECTOMY Right 04/17/2019   Procedure: LAPAROSCOPIC UNILATERAL SALPINGECTOMY;  Surgeon: Schermerhorn, Gwen Her, MD;  Location: [facility];  Service: Gynecology;  Laterality: Right;  . TONSILLECTOMY    . TUBAL LIGATION  OB History    Gravida  3   Para  1   Term  1   Preterm      AB      Living  3     SAB      TAB      Ectopic      Multiple      Live Births  3            Home Medications    Prior to Admission medications   Medication Sig Start Date End Date Taking? Authorizing Provider  buPROPion (WELLBUTRIN XL) 150 MG 24 hr tablet Take 300 mg by mouth daily. 02/24/19  Yes [provider]  busPIRone (BUSPAR) 10 MG tablet Take 10 mg by mouth 2 (two)  times daily.   Yes [provider]  clonazePAM (KLONOPIN) 0.5 MG tablet Take 0.5 mg by mouth daily as needed for anxiety. 06/07/18  Yes [provider]  ergocalciferol (VITAMIN D2) 1.25 MG (50000 UT) capsule Take 50,000 Units by mouth once a week.   Yes [provider]  ferrous sulfate 325 (65 FE) MG tablet Take 325 mg by mouth daily with breakfast.   Yes [provider]  Ipratropium-Albuterol (COMBIVENT) 20-100 MCG/ACT AERS respimat Inhale 1 puff into the lungs every 6 (six) hours as needed for wheezing or shortness of breath.  01/20/18  Yes [provider]  Iron-Vitamin C 65-125 MG TABS 1 tablet daily x 1 week, then increase to 1 tablet twice a day. 03/01/18  Yes Karen Kitchens, NP  levothyroxine (SYNTHROID) 50 MCG tablet Take 50 mcg by mouth daily before breakfast.   Yes [provider]  omeprazole (PRILOSEC) 40 MG capsule Take 40 mg by mouth daily.    Yes [provider]  oxyCODONE-acetaminophen (PERCOCET) 5-325 MG tablet Take 1 tablet by mouth every 6 (six) hours as needed for severe pain. 01/03/19 01/03/20 Yes Nance Pear, MD  vitamin B-12 (CYANOCOBALAMIN) 1000 MCG tablet Take 1,000 mcg by mouth daily.   Yes [provider]  albuterol (PROVENTIL HFA;VENTOLIN HFA) 108 (90 Base) MCG/ACT inhaler Inhale 2 puffs into the lungs every 6 (six) hours as needed for wheezing or shortness of breath.  06/10/17 04/17/19  [provider]  escitalopram (LEXAPRO) 20 MG tablet Take 20 mg by mouth daily.  12/04/15 04/05/19  [provider]  meloxicam (MOBIC) 15 MG tablet Take 1 tablet (15 mg total) by mouth daily as needed for pain. 09/19/19   Coral Spikes, DO    Family History Family History  Problem Relation Age of Onset  . Ovarian cancer Mother   . Vaginal cancer Mother   . Cancer Mother   . Breast cancer Paternal Aunt   . Diabetes Paternal Grandmother   . Diabetes Paternal Grandfather   . Breast cancer Paternal  Aunt   . Colon cancer Neg Hx   . Heart disease Neg Hx     Social History Social History   Tobacco Use  . Smoking status: Former Smoker    Quit date: 04/11/2010    Years since quitting: 9.4  . Smokeless tobacco: Never Used  Vaping Use  . Vaping Use: Never used  Substance Use Topics  . Alcohol use: No  . Drug use: No     Allergies   Dilantin [phenytoin sodium extended]   Review of Systems Review of Systems  HENT: Positive for trouble swallowing.   Musculoskeletal: Positive for neck pain.   Physical Exam Triage Vital Signs  ED Triage Vitals [09/19/19 1645]  Enc Vitals Group     BP 124/82     Pulse Rate 90     Resp 18     Temp 98.1 F (36.7 C)     Temp Source Oral     SpO2 100 %     Weight 270 lb 1 oz (122.5 kg)     Height _0  (1.676 m)     Head Circumference      Peak Flow      Pain Score 9     Pain Loc      Pain Edu?      Excl. in [place]?    Updated Vital Signs BP 124/82 (BP Location: Right Arm)   Pulse 90   Temp 98.1 F (36.7 C) (Oral)   Resp 18   Ht _1  (1.676 m)   Wt 122.5 kg   LMP 03/22/2019   SpO2 100%   BMI 43.59 kg/m   Visual Acuity Right Eye Distance:   Left Eye Distance:   Bilateral Distance:    Right Eye Near:   Left Eye Near:    Bilateral Near:     Physical Exam Vitals and nursing note reviewed.  Constitutional:      Appearance: Normal appearance. She is obese.  HENT:     Head: Normocephalic and atraumatic.  Eyes:     General:        Right eye: No discharge.        Left eye: No discharge.     Conjunctiva/sclera: Conjunctivae normal.  Neck:      Comments: No appreciable thyromegaly. Patient has a focal area of tenderness at the level location. Neurological:     Mental Status: She is alert.    UC Treatments / Results  Labs (all labs ordered are listed, but only abnormal results are displayed) Labs Reviewed - No data to display  EKG   Radiology US THYROID  Result Date: 09/19/2019 CLINICAL DATA:  Neck pain. EXAM:  THYROID ULTRASOUND TECHNIQUE: Ultrasound examination of the thyroid gland and adjacent soft tissues was performed. COMPARISON:  None. FINDINGS: Parenchymal Echotexture: Normal Isthmus: 0.4 cm Right lobe: 3.7 x 1.3 x 1.6 cm Left lobe: 3.5 x 1.2 x 1.6 cm _________________________________________________________ Estimated total number of nodules >/= 1 cm: 0 Number of spongiform nodules >/=  2 cm not described below (TR1): 0 Number of mixed cystic and solid nodules >/= 1.5 cm not described below (TR2): 0 _________________________________________________________ No discrete nodules are seen within the thyroid gland. IMPRESSION: Normal study. The above is in keeping with the ACR TI-RADS recommendations - J Am Coll Radiol 2017;14:587-595. Electronically Signed   By: Constance Holster M.D.   On: 09/19/2019 19:19    Procedures Procedures (including critical care time)  Medications Ordered in UC Medications - No data to display  Initial Impression / Assessment and Plan / UC Course  I have reviewed the triage vital signs and the nursing notes.  Pertinent labs & imaging results that were available during my care of the patient were reviewed by me and considered in my medical decision making (see chart for details).    39 year old female presents with ongoing neck pain.  Ultrasound obtained and was normal.  No issues with the thyroid.  The area of concern was a normal appearing lymph node after discussion with the ultrasound technician.  Mobic as needed for pain.  Advise follow-up with PCP to discuss ENT referral versus watchful waiting versus further imaging.  Final Clinical Impressions(s) / UC Diagnoses   Final diagnoses:  Neck pain   Discharge Instructions   None    ED Prescriptions    None     PDMP not reviewed this encounter.   Coral Spikes, DO 09/20/19 0840    Coral Spikes, DO 09/20/19 0840

## 2019-10-04 ENCOUNTER — Institutional Professional Consult (permissible substitution): Payer: Medicaid Other | Admitting: Pulmonary Disease

## 2019-11-01 ENCOUNTER — Institutional Professional Consult (permissible substitution): Payer: Medicaid Other | Admitting: Pulmonary Disease

## 2019-11-12 ENCOUNTER — Ambulatory Visit
Admission: EM | Admit: 2019-11-12 | Discharge: 2019-11-12 | Disposition: A | Discharge status: Home / Self Care | Payer: Medicaid Other | Attending: Internal Medicine | Admitting: Internal Medicine

## 2019-11-12 ENCOUNTER — Other Ambulatory Visit: Payer: Self-pay

## 2019-11-12 DIAGNOSIS — G40909 Epilepsy, unspecified, not intractable, without status epilepticus: Secondary | ICD-10-CM | POA: Diagnosis not present

## 2019-11-12 DIAGNOSIS — J4521 Mild intermittent asthma with (acute) exacerbation: Secondary | ICD-10-CM

## 2019-11-12 DIAGNOSIS — E039 Hypothyroidism, unspecified: Secondary | ICD-10-CM | POA: Insufficient documentation

## 2019-11-12 DIAGNOSIS — E559 Vitamin D deficiency, unspecified: Secondary | ICD-10-CM | POA: Diagnosis not present

## 2019-11-12 DIAGNOSIS — M797 Fibromyalgia: Secondary | ICD-10-CM | POA: Insufficient documentation

## 2019-11-12 DIAGNOSIS — Z791 Long term (current) use of non-steroidal anti-inflammatories (NSAID): Secondary | ICD-10-CM | POA: Diagnosis not present

## 2019-11-12 DIAGNOSIS — E669 Obesity, unspecified: Secondary | ICD-10-CM | POA: Diagnosis not present

## 2019-11-12 DIAGNOSIS — Z79899 Other long term (current) drug therapy: Secondary | ICD-10-CM | POA: Insufficient documentation

## 2019-11-12 DIAGNOSIS — Z87891 Personal history of nicotine dependence: Secondary | ICD-10-CM | POA: Diagnosis not present

## 2019-11-12 DIAGNOSIS — K219 Gastro-esophageal reflux disease without esophagitis: Secondary | ICD-10-CM | POA: Insufficient documentation

## 2019-11-12 DIAGNOSIS — Z7952 Long term (current) use of systemic steroids: Secondary | ICD-10-CM | POA: Insufficient documentation

## 2019-11-12 DIAGNOSIS — U071 COVID-19: Secondary | ICD-10-CM | POA: Insufficient documentation

## 2019-11-12 DIAGNOSIS — B349 Viral infection, unspecified: Secondary | ICD-10-CM

## 2019-11-12 MED ORDER — BENZONATATE 100 MG PO CAPS: 100.0000 mg | ORAL_CAPSULE | Freq: Three times a day (TID) | ORAL | 0 refills | Status: AC

## 2019-11-12 MED ORDER — GUAIFENESIN ER 600 MG PO TB12: 600.0000 mg | ORAL_TABLET | Freq: Two times a day (BID) | ORAL | 0 refills | Status: AC

## 2019-11-12 MED ORDER — PREDNISONE 20 MG PO TABS
20.0000 mg | ORAL_TABLET | Freq: Every day | ORAL | 0 refills | Status: AC
Start: 2019-11-12 — End: 2019-11-17

## 2019-11-12 MED ORDER — ALBUTEROL SULFATE HFA 108 (90 BASE) MCG/ACT IN AERS: 2.0000 | INHALATION_SPRAY | Freq: Four times a day (QID) | RESPIRATORY_TRACT | 0 refills | Status: AC | PRN

## 2019-11-12 NOTE — Discharge Instructions (Addendum)
Please quarantine until COVID-19 test results available Check your results through the MyChart app If your symptoms worsen do not hesitate to return to urgent care to be reevaluated. You could also engage the [facility] health urgent care through the virtual visit platform.

## 2019-11-12 NOTE — ED Triage Notes (Signed)
Pt reports having cough, fever (100.7), headache and muscle aches that began 2 days ago. No known covid exposure.

## 2019-11-13 LAB — SARS CORONAVIRUS 2 (TAT 6-24 HRS): SARS Coronavirus 2: POSITIVE — AB

## 2019-11-14 ENCOUNTER — Other Ambulatory Visit: Payer: Self-pay

## 2019-11-14 ENCOUNTER — Encounter: Payer: Self-pay | Admitting: Emergency Medicine

## 2019-11-14 ENCOUNTER — Emergency Department: Payer: Medicaid Other

## 2019-11-14 DIAGNOSIS — R079 Chest pain, unspecified: Secondary | ICD-10-CM | POA: Insufficient documentation

## 2019-11-14 DIAGNOSIS — Z5321 Procedure and treatment not carried out due to patient leaving prior to being seen by health care provider: Secondary | ICD-10-CM | POA: Diagnosis not present

## 2019-11-14 DIAGNOSIS — R0602 Shortness of breath: Secondary | ICD-10-CM | POA: Insufficient documentation

## 2019-11-14 DIAGNOSIS — Z8616 Personal history of COVID-19: Secondary | ICD-10-CM | POA: Diagnosis not present

## 2019-11-14 LAB — CBC
HCT: 45 % (ref 36.0–46.0)
Hemoglobin: 14.7 g/dL (ref 12.0–15.0)
MCH: 28.2 pg (ref 26.0–34.0)
MCHC: 32.7 g/dL (ref 30.0–36.0)
MCV: 86.2 fL (ref 80.0–100.0)
Platelets: 211 10*3/uL (ref 150–400)
RBC: 5.22 MIL/uL — ABNORMAL HIGH (ref 3.87–5.11)
RDW: 13.7 % (ref 11.5–15.5)
WBC: 2.2 10*3/uL — ABNORMAL LOW (ref 4.0–10.5)
nRBC: 0 % (ref 0.0–0.2)

## 2019-11-14 LAB — BASIC METABOLIC PANEL
Anion gap: 12 (ref 5–15)
BUN: 8 mg/dL (ref 6–20)
CO2: 24 mmol/L (ref 22–32)
Calcium: 8.6 mg/dL — ABNORMAL LOW (ref 8.9–10.3)
Chloride: 103 mmol/L (ref 98–111)
Creatinine, Ser: 0.81 mg/dL (ref 0.44–1.00)
GFR calc Af Amer: >60 mL/min (ref >60)
GFR calc non Af Amer: >60 mL/min (ref >60)
Glucose, Bld: 94 mg/dL (ref 70–99)
Potassium: 3.7 mmol/L (ref 3.5–5.1)
Sodium: 139 mmol/L (ref 135–145)

## 2019-11-14 LAB — TROPONIN I (HIGH SENSITIVITY)
Troponin I (High Sensitivity): 4 ng/L (ref <18)
Troponin I (High Sensitivity): 5 ng/L (ref <18)

## 2019-11-14 NOTE — ED Triage Notes (Signed)
Pt presents via POV with c/o chest pain. Pt recently diagnosed with covid on Monday. Pt has increased chest pain since then. Pt also endorses shortness of breath with exertion.

## 2019-11-14 NOTE — ED Provider Notes (Addendum)
[facility]    CSN: [id number] Arrival date & time: 11/12/19  1249      History   Chief Complaint Chief Complaint  Patient presents with  . Cough    HPI Colleen Grimes is a 39 y.o. female comes to the urgent care with complaints of cough, headaches, generalized body aches and a fever of 100.7 Fahrenheit.  Patient symptoms started 2 days ago and has been persistent.  She is vaccinated against COVID-19 infection.  She denies any sick contacts.  No shortness of breath or wheezing.  No nausea vomiting or diarrhea.   HPI  Past Medical History:  Diagnosis Date  . Acid reflux   . Anemia   . Asthma   . BRCA negative 03/2017   MyRisk neg  . Difficult intubation   . Dyspnea    occasionally  . Epilepsy ([place])   . Family history of ovarian cancer 03/2017   MyRisk neg  . Heart murmur    tricuspid valve "leaks"  . Hypothyroidism   . Pneumonia   . Seizures ([place])    no seizure in several years    Patient Active Problem List   Diagnosis Date Noted  . Abnormal radionuclide bone scan 03/27/2018  . Elevated alkaline phosphatase level 03/18/2018  . Bone pain 03/09/2018  . Menorrhagia with regular cycle 03/09/2018  . Low vitamin B12 level 03/09/2018  . Iron deficiency anemia 03/01/2018  . Chest pain on exertion 01/26/2018  . Malaise and fatigue 01/26/2018  . Shortness of breath on exertion 01/26/2018  . Snoring 01/26/2018  . Acute respiratory distress 06/24/2017  . Community acquired pneumonia 06/09/2017  . BRCA negative 04/04/2017  . Right foot pain 11/10/2016  . Bursitis of left elbow 07/26/2016  . Fibromyalgia muscle pain 01/26/2016  . Muscle strain 01/26/2016  . Vitamin D deficiency 01/26/2016  . Heartburn   . Acquired hypothyroidism 12/23/2015  . Anxiety state 12/23/2015  . Chronic fatigue, unspecified 12/15/2015  . Mild obesity 12/15/2015  . Multiple joint pain 12/15/2015  . GERD (gastroesophageal reflux disease) 12/09/2015  . Seizures ([place]) 12/09/2015      Past Surgical History:  Procedure Laterality Date  . CESAREAN SECTION    . CYSTOSCOPY  04/17/2019   Procedure: CYSTOSCOPY;  Surgeon: Schermerhorn, Gwen Her, MD;  Location: [facility];  Service: Gynecology;;  . ESOPHAGOGASTRODUODENOSCOPY (EGD) WITH PROPOFOL N/A 01/02/2016   Procedure: ESOPHAGOGASTRODUODENOSCOPY (EGD) WITH PROPOFOL;  Surgeon: Lucilla Lame, MD;  Location: [place];  Service: Endoscopy;  Laterality: N/A;  . LAPAROSCOPIC LYSIS OF ADHESIONS  04/17/2019   Procedure: LAPAROSCOPIC LYSIS OF ADHESIONS;  Surgeon: Ouida Sills, Gwen Her, MD;  Location: [facility];  Service: Gynecology;;  . LAPAROSCOPIC SUPRACERVICAL HYSTERECTOMY N/A 04/17/2019   Procedure: LAPAROSCOPIC SUPRACERVICAL HYSTERECTOMY;  Surgeon: Schermerhorn, Gwen Her, MD;  Location: [facility];  Service: Gynecology;  Laterality: N/A;  . LAPAROSCOPIC UNILATERAL SALPINGECTOMY Right 04/17/2019   Procedure: LAPAROSCOPIC UNILATERAL SALPINGECTOMY;  Surgeon: Schermerhorn, Gwen Her, MD;  Location: [facility];  Service: Gynecology;  Laterality: Right;  . TONSILLECTOMY    . TUBAL LIGATION      OB History    Gravida  3   Para  1   Term  1   Preterm      AB      Living  3     SAB      TAB      Ectopic      Multiple      Live Births  3  Home Medications    Prior to Admission medications   Medication Sig Start Date End Date Taking? Authorizing Provider  albuterol (VENTOLIN HFA) 108 (90 Base) MCG/ACT inhaler Inhale 2 puffs into the lungs every 6 (six) hours as needed for wheezing or shortness of breath. 11/12/19 09/18/21  Shadasia Oldfield, Myrene Galas, MD  benzonatate (TESSALON) 100 MG capsule Take 1 capsule (100 mg total) by mouth every 8 (eight) hours. 11/12/19   Chase Picket, MD  buPROPion (WELLBUTRIN XL) 150 MG 24 hr tablet Take 300 mg by mouth daily. 02/24/19   [provider]  busPIRone (BUSPAR) 10 MG tablet Take 10 mg by mouth 2 (two) times daily.    [provider]  clonazePAM  (KLONOPIN) 0.5 MG tablet Take 0.5 mg by mouth daily as needed for anxiety. 06/07/18   [provider]  ergocalciferol (VITAMIN D2) 1.25 MG (50000 UT) capsule Take 50,000 Units by mouth once a week.    [provider]  escitalopram (LEXAPRO) 20 MG tablet Take 20 mg by mouth daily.  12/04/15 04/05/19  [provider]  ferrous sulfate 325 (65 FE) MG tablet Take 325 mg by mouth daily with breakfast.    [provider]  guaiFENesin (MUCINEX) 600 MG 12 hr tablet Take 1 tablet (600 mg total) by mouth 2 (two) times daily for 15 days. 11/12/19 11/27/19  Chase Picket, MD  Ipratropium-Albuterol (COMBIVENT) 20-100 MCG/ACT AERS respimat Inhale 1 puff into the lungs every 6 (six) hours as needed for wheezing or shortness of breath.  01/20/18   [provider]  Iron-Vitamin C 65-125 MG TABS 1 tablet daily x 1 week, then increase to 1 tablet twice a day. 03/01/18   Karen Kitchens, NP  levothyroxine (SYNTHROID) 50 MCG tablet Take 50 mcg by mouth daily before breakfast.    [provider]  meloxicam (MOBIC) 15 MG tablet Take 1 tablet (15 mg total) by mouth daily as needed for pain. 09/19/19   Coral Spikes, DO  omeprazole (PRILOSEC) 40 MG capsule Take 40 mg by mouth daily.     [provider]  oxyCODONE-acetaminophen (PERCOCET) 5-325 MG tablet Take 1 tablet by mouth every 6 (six) hours as needed for severe pain. 01/03/19 01/03/20  Nance Pear, MD  predniSONE (DELTASONE) 20 MG tablet Take 1 tablet (20 mg total) by mouth daily for 5 days. 11/12/19 11/17/19  Chase Picket, MD  vitamin B-12 (CYANOCOBALAMIN) 1000 MCG tablet Take 1,000 mcg by mouth daily.    [provider]    Family History Family History  Problem Relation Age of Onset  . Ovarian cancer Mother   . Vaginal cancer Mother   . Cancer Mother   . Breast cancer Paternal Aunt   . Diabetes Paternal Grandmother   . Diabetes Paternal Grandfather   . Breast cancer Paternal Aunt   .  Colon cancer Neg Hx   . Heart disease Neg Hx     Social History Social History   Tobacco Use  . Smoking status: Former Smoker    Quit date: 04/11/2010    Years since quitting: 9.6  . Smokeless tobacco: Never Used  Vaping Use  . Vaping Use: Never used  Substance Use Topics  . Alcohol use: No  . Drug use: No     Allergies   Dilantin [phenytoin sodium extended]   Review of Systems Review of Systems  Constitutional: Positive for fever. Negative for activity change, chills and fatigue.  Respiratory: Positive for cough. Negative for  shortness of breath and wheezing.   Cardiovascular: Negative.  Negative for chest pain and palpitations.  Gastrointestinal: Negative.  Negative for diarrhea, nausea and vomiting.  Musculoskeletal: Positive for arthralgias and myalgias. Negative for joint swelling.  Neurological: Negative.      Physical Exam Triage Vital Signs ED Triage Vitals  Enc Vitals Group     BP 11/12/19 1403 118/77     Pulse Rate 11/12/19 1403 66     Resp 11/12/19 1403 18     Temp 11/12/19 1403 97.6 F (36.4 C)     Temp Source 11/12/19 1403 Oral     SpO2 11/12/19 1403 99 %     Weight 11/12/19 1404 270 lb (122.5 kg)     Height 11/12/19 1404 _0  (1.676 m)     Head Circumference --      Peak Flow --      Pain Score 11/12/19 1403 7     Pain Loc --      Pain Edu? --      Excl. in [place]? --    No data found.  Updated Vital Signs BP 118/77   Pulse 66   Temp 97.6 F (36.4 C) (Oral)   Resp 18   Ht _1  (1.676 m)   Wt 122.5 kg   LMP 03/22/2019   SpO2 99%   BMI 43.58 kg/m   Visual Acuity Right Eye Distance:   Left Eye Distance:   Bilateral Distance:    Right Eye Near:   Left Eye Near:    Bilateral Near:     Physical Exam Vitals and nursing note reviewed.  Constitutional:      General: She is not in acute distress.    Appearance: She is not ill-appearing.  HENT:     Right Ear: Tympanic membrane normal.     Left Ear: Tympanic membrane normal.      Mouth/Throat:     Pharynx: No oropharyngeal exudate or posterior oropharyngeal erythema.  Cardiovascular:     Rate and Rhythm: Normal rate and regular rhythm.     Pulses: Normal pulses.     Heart sounds: Normal heart sounds.  Abdominal:     General: Bowel sounds are normal.     Palpations: Abdomen is soft.  Musculoskeletal:     Cervical back: Normal range of motion.  Lymphadenopathy:     Cervical: No cervical adenopathy.  Skin:    Capillary Refill: Capillary refill takes less than 2 seconds.  Neurological:     Mental Status: She is alert.      UC Treatments / Results  Labs (all labs ordered are listed, but only abnormal results are displayed) Labs Reviewed  SARS CORONAVIRUS 2 (TAT 6-24 HRS) - Abnormal; Notable for the following components:      Result Value   SARS Coronavirus 2 POSITIVE (*)    All other components within normal limits    EKG   Radiology No results found.  Procedures Procedures (including critical care time)  Medications Ordered in UC Medications - No data to display  Initial Impression / Assessment and Plan / UC Course  I have reviewed the triage vital signs and the nursing notes.  Pertinent labs & imaging results that were available during my care of the patient were reviewed by me and considered in my medical decision making (see chart for details).     1.  Mild intermittent asthmatic bronchitis with acute exacerbation: Albuterol inhaler as needed for shortness of breath Prednisone 20 mg daily  for 5 days Humibid 600 mg twice daily. COVID-19 PCR has been sent Tessalon Perles as needed for cough Return precautions given Push oral fluids   2.  Viral syndrome: Testing as above Patient is advised to quarantine until COVID-19 test is available Patient is COVID-19 positive, she would need to quarantine for 10 days. Final Clinical Impressions(s) / UC Diagnoses   Final diagnoses:  Mild intermittent acute asthmatic bronchitis with acute  exacerbation  Viral illness     Discharge Instructions     Please quarantine until COVID-19 test results available Check your results through the MyChart app If your symptoms worsen do not hesitate to return to urgent care to be reevaluated. You could also engage the [facility] health urgent care through the virtual visit platform.    ED Prescriptions    Medication Sig Dispense Auth. Provider   albuterol (VENTOLIN HFA) 108 (90 Base) MCG/ACT inhaler Inhale 2 puffs into the lungs every 6 (six) hours as needed for wheezing or shortness of breath. 18 g Kenyetta Fife, Myrene Galas, MD   benzonatate (TESSALON) 100 MG capsule Take 1 capsule (100 mg total) by mouth every 8 (eight) hours. 21 capsule Shaniyah Wix, Myrene Galas, MD   guaiFENesin (MUCINEX) 600 MG 12 hr tablet Take 1 tablet (600 mg total) by mouth 2 (two) times daily for 15 days. 30 tablet Christian Treadway, Myrene Galas, MD   predniSONE (DELTASONE) 20 MG tablet Take 1 tablet (20 mg total) by mouth daily for 5 days. 5 tablet Prestyn Mahn, Myrene Galas, MD     PDMP not reviewed this encounter.   Chase Picket, MD 11/14/19 1641    Chase Picket, MD 11/14/19 [phone]

## 2019-11-15 ENCOUNTER — Emergency Department
Admission: EM | Admit: 2019-11-15 | Discharge: 2019-11-15 | Disposition: A | Discharge status: Home / Self Care | Payer: Medicaid Other | Attending: Emergency Medicine | Admitting: Emergency Medicine

## 2019-11-15 NOTE — ED Notes (Signed)
No answer when called several times from lobby 

## 2019-12-20 ENCOUNTER — Emergency Department: Payer: Medicaid Other

## 2019-12-20 ENCOUNTER — Emergency Department
Admission: EM | Admit: 2019-12-20 | Discharge: 2019-12-20 | Disposition: A | Discharge status: Home / Self Care | Payer: Medicaid Other | Attending: Emergency Medicine | Admitting: Emergency Medicine

## 2019-12-20 ENCOUNTER — Other Ambulatory Visit: Payer: Self-pay

## 2019-12-20 DIAGNOSIS — R519 Headache, unspecified: Secondary | ICD-10-CM | POA: Diagnosis not present

## 2019-12-20 DIAGNOSIS — Z79899 Other long term (current) drug therapy: Secondary | ICD-10-CM | POA: Diagnosis not present

## 2019-12-20 DIAGNOSIS — E039 Hypothyroidism, unspecified: Secondary | ICD-10-CM | POA: Insufficient documentation

## 2019-12-20 DIAGNOSIS — R569 Unspecified convulsions: Secondary | ICD-10-CM | POA: Diagnosis present

## 2019-12-20 DIAGNOSIS — R5381 Other malaise: Secondary | ICD-10-CM | POA: Diagnosis not present

## 2019-12-20 DIAGNOSIS — J45909 Unspecified asthma, uncomplicated: Secondary | ICD-10-CM | POA: Insufficient documentation

## 2019-12-20 DIAGNOSIS — Z87891 Personal history of nicotine dependence: Secondary | ICD-10-CM | POA: Diagnosis not present

## 2019-12-20 LAB — CBC WITH DIFFERENTIAL/PLATELET
Abs Immature Granulocytes: 0.07 10*3/uL (ref 0.00–0.07)
Basophils Absolute: 0.1 10*3/uL (ref 0.0–0.1)
Basophils Relative: 1 %
Eosinophils Absolute: 0.3 10*3/uL (ref 0.0–0.5)
Eosinophils Relative: 3 %
HCT: 41.2 % (ref 36.0–46.0)
Hemoglobin: 13.9 g/dL (ref 12.0–15.0)
Immature Granulocytes: 1 %
Lymphocytes Relative: 21 %
Lymphs Abs: 2 10*3/uL (ref 0.7–4.0)
MCH: 28.5 pg (ref 26.0–34.0)
MCHC: 33.7 g/dL (ref 30.0–36.0)
MCV: 84.6 fL (ref 80.0–100.0)
Monocytes Absolute: 0.5 10*3/uL (ref 0.1–1.0)
Monocytes Relative: 5 %
Neutro Abs: 6.8 10*3/uL (ref 1.7–7.7)
Neutrophils Relative %: 69 %
Platelets: 399 10*3/uL (ref 150–400)
RBC: 4.87 MIL/uL (ref 3.87–5.11)
RDW: 14 % (ref 11.5–15.5)
WBC: 9.7 10*3/uL (ref 4.0–10.5)
nRBC: 0 % (ref 0.0–0.2)

## 2019-12-20 LAB — BASIC METABOLIC PANEL
Anion gap: 9 (ref 5–15)
BUN: 9 mg/dL (ref 6–20)
CO2: 28 mmol/L (ref 22–32)
Calcium: 8.9 mg/dL (ref 8.9–10.3)
Chloride: 102 mmol/L (ref 98–111)
Creatinine, Ser: 0.85 mg/dL (ref 0.44–1.00)
GFR calc Af Amer: >60 mL/min (ref >60)
GFR calc non Af Amer: >60 mL/min (ref >60)
Glucose, Bld: 88 mg/dL (ref 70–99)
Potassium: 3.7 mmol/L (ref 3.5–5.1)
Sodium: 139 mmol/L (ref 135–145)

## 2019-12-20 MED ORDER — SODIUM CHLORIDE 0.9 % IV BOLUS
1000.0000 mL | Freq: Once | INTRAVENOUS | Status: AC
  Administered 2019-12-20: 1000 mL via INTRAVENOUS

## 2019-12-20 MED ORDER — LORAZEPAM 2 MG/ML IJ SOLN
1.0000 mg | Freq: Once | INTRAMUSCULAR | Status: AC
  Administered 2019-12-20: 1 mg via INTRAVENOUS
  Filled 2019-12-20: qty 1

## 2019-12-20 MED ORDER — KETOROLAC TROMETHAMINE 30 MG/ML IJ SOLN
15.0000 mg | Freq: Once | INTRAMUSCULAR | Status: AC
  Administered 2019-12-20: 15 mg via INTRAVENOUS
  Filled 2019-12-20: qty 1

## 2019-12-20 MED ORDER — PROCHLORPERAZINE EDISYLATE 10 MG/2ML IJ SOLN
10.0000 mg | Freq: Once | INTRAMUSCULAR | Status: AC
  Administered 2019-12-20: 10 mg via INTRAVENOUS
  Filled 2019-12-20: qty 2

## 2019-12-20 NOTE — Discharge Instructions (Addendum)
As we discussed please do not drive or put yourself in a position that could be dangerous to yourself or others if you were to have another seizure.

## 2019-12-20 NOTE — ED Notes (Addendum)
Husband at bedside, sts pt had headaches in the past after having seizures. He also sts she has been having a lot of stress in the last few weeks, including the passing of her father last week. He believes this is the cause of her seizure. Husband also sts she quit taking seizure meds "years ago" because they made her "feel so blah." Pt last seizure was apparently 12 years ago. Pt currently resting in stretcher, c/o headache. Placed on 2 L [place] for low SpO2

## 2019-12-20 NOTE — ED Triage Notes (Signed)
BIB [facility] from [facility]. Pt told EMS she did not feel well and went to her car. Pt thinks she had a seizure. Pt with hx of seizures but does not take any meds. EMS report pt alert X 1.  CBG 97 BP 116/72 HR 82 O2 98  Pt alert and oriented X4 at this time, states she recalls feeling "funny" while in the store and going to her car. 18G to [facility]

## 2019-12-20 NOTE — ED Provider Notes (Signed)
[facility] Emergency Department Provider Note  ____________________________________________   I have reviewed the triage vital signs and the nursing notes.   HISTORY  Chief Complaint Possible seizure  History limited by: Not Limited   HPI Colleen Grimes is a 38 y.o. female who presents to the emergency department today because of concern for a possible seizure and feeling unwell.  The patient states that she was in her normal state of health earlier today.  She then went to a store and started to feel off.  She has a hard time describing exactly how she did not feel correct.  She thinks she might of then had a seizure.  She is now complaining of a headache.  Last had a seizure 12 years ago.  Typically she does feel off before and then will have a headache afterwards.  Has been under a lot of stress recently due to taking care of her ill father who passed away 1 week ago.  Patient denies any extremity pain.  Denies any chest pain or shortness of breath.   Records reviewed. Per medical record review patient has a history of seizures.  Past Medical History:  Diagnosis Date  . Acid reflux   . Anemia   . Asthma   . BRCA negative 03/2017   MyRisk neg  . Difficult intubation   . Dyspnea    occasionally  . Epilepsy (HCC)   . Family history of ovarian cancer 03/2017   MyRisk neg  . Heart murmur    tricuspid valve "leaks"  . Hypothyroidism   . Pneumonia   . Seizures (HCC)    no seizure in several years    Patient Active Problem List   Diagnosis Date Noted  . Abnormal radionuclide bone scan 03/27/2018  . Elevated alkaline phosphatase level 03/18/2018  . Bone pain 03/09/2018  . Menorrhagia with regular cycle 03/09/2018  . Low vitamin B12 level 03/09/2018  . Iron deficiency anemia 03/01/2018  . Chest pain on exertion 01/26/2018  . Malaise and fatigue 01/26/2018  . Shortness of breath on exertion 01/26/2018  . Snoring 01/26/2018  . Acute respiratory  distress 06/24/2017  . Community acquired pneumonia 06/09/2017  . BRCA negative 04/04/2017  . Right foot pain 11/10/2016  . Bursitis of left elbow 07/26/2016  . Fibromyalgia muscle pain 01/26/2016  . Muscle strain 01/26/2016  . Vitamin D deficiency 01/26/2016  . Heartburn   . Acquired hypothyroidism 12/23/2015  . Anxiety state 12/23/2015  . Chronic fatigue, unspecified 12/15/2015  . Mild obesity 12/15/2015  . Multiple joint pain 12/15/2015  . GERD (gastroesophageal reflux disease) 12/09/2015  . Seizures (HCC) 12/09/2015    Past Surgical History:  Procedure Laterality Date  . CESAREAN SECTION    . CYSTOSCOPY  04/17/2019   Procedure: CYSTOSCOPY;  Surgeon: Schermerhorn, Thomas J, MD;  Location: [facility] ORS;  Service: Gynecology;;  . ESOPHAGOGASTRODUODENOSCOPY (EGD) WITH PROPOFOL N/A 01/02/2016   Procedure: ESOPHAGOGASTRODUODENOSCOPY (EGD) WITH PROPOFOL;  Surgeon: Darren Wohl, MD;  Location: [facility];  Service: Endoscopy;  Laterality: N/A;  . LAPAROSCOPIC LYSIS OF ADHESIONS  04/17/2019   Procedure: LAPAROSCOPIC LYSIS OF ADHESIONS;  Surgeon: Schermerhorn, Thomas J, MD;  Location: [facility] OR[facility];  Service: Gynecology;;  . LAPAROSCOPIC SUPRACERVICAL HYSTERECTOMY N/A 04/17/2019   Procedure: LAPAROSCOPIC SUPRACERVICAL HYSTERECTOMY;  Surgeon: Schermerhorn, Thomas J, MD;  Location: [facility] OR[facility];  Service: Gynecology;  Laterality: N/A;  . LAPAROSCOPIC UNILATERAL SALPINGECTOMY Right 04/17/2019   Procedure: LAPAROSCOPIC UNILATERAL SALPINGECTOMY;  Surgeon: Schermerhorn, Thomas J, MD;  Location:   [facility] OR[facility];  Service: Gynecology;  Laterality: Right;  . TONSILLECTOMY    . TUBAL LIGATION      Prior to Admission medications   Medication Sig Start Date End Date Taking? Authorizing Provider  albuterol (VENTOLIN HFA) 108 (90 Base) MCG/ACT inhaler Inhale 2 puffs into the lungs every 6 (six) hours as needed for wheezing or shortness of breath. 11/12/19 09/18/21  Lamptey, Philip O, MD  benzonatate (TESSALON) 100  MG capsule Take 1 capsule (100 mg total) by mouth every 8 (eight) hours. 11/12/19   Lamptey, Philip O, MD  buPROPion (WELLBUTRIN XL) 150 MG 24 hr tablet Take 300 mg by mouth daily. 02/24/19   [provider]  busPIRone (BUSPAR) 10 MG tablet Take 10 mg by mouth 2 (two) times daily.    [provider]  clonazePAM (KLONOPIN) 0.5 MG tablet Take 0.5 mg by mouth daily as needed for anxiety. 06/07/18   [provider]  ergocalciferol (VITAMIN D2) 1.25 MG (50000 UT) capsule Take 50,000 Units by mouth once a week.    [provider]  escitalopram (LEXAPRO) 20 MG tablet Take 20 mg by mouth daily.  12/04/15 04/05/19  [provider]  ferrous sulfate 325 (65 FE) MG tablet Take 325 mg by mouth daily with breakfast.    [provider]  Ipratropium-Albuterol (COMBIVENT) 20-100 MCG/ACT AERS respimat Inhale 1 puff into the lungs every 6 (six) hours as needed for wheezing or shortness of breath.  01/20/18   [provider]  Iron-Vitamin C 65-125 MG TABS 1 tablet daily x 1 week, then increase to 1 tablet twice a day. 03/01/18   Gray, Bryan E, NP  levothyroxine (SYNTHROID) 50 MCG tablet Take 50 mcg by mouth daily before breakfast.    [provider]  meloxicam (MOBIC) 15 MG tablet Take 1 tablet (15 mg total) by mouth daily as needed for pain. 09/19/19   Cook, Jayce G, DO  omeprazole (PRILOSEC) 40 MG capsule Take 40 mg by mouth daily.     [provider]  oxyCODONE-acetaminophen (PERCOCET) 5-325 MG tablet Take 1 tablet by mouth every 6 (six) hours as needed for severe pain. 01/03/19 01/03/20  Goodman, Graydon, MD  vitamin B-12 (CYANOCOBALAMIN) 1000 MCG tablet Take 1,000 mcg by mouth daily.    [provider]    Allergies Dilantin [phenytoin sodium extended]  Family History  Problem Relation Age of Onset  . Ovarian cancer Mother   . Vaginal cancer Mother   . Cancer Mother   . Breast cancer Paternal Aunt   . Diabetes Paternal  Grandmother   . Diabetes Paternal Grandfather   . Breast cancer Paternal Aunt   . Colon cancer Neg Hx   . Heart disease Neg Hx     Social History Social History   Tobacco Use  . Smoking status: Former Smoker    Quit date: 04/11/2010    Years since quitting: 9.6  . Smokeless tobacco: Never Used  Vaping Use  . Vaping Use: Never used  Substance Use Topics  . Alcohol use: No  . Drug use: No    Review of Systems Constitutional: Positive for feeling unwell. Eyes: No visual changes. ENT: No sore throat. Cardiovascular: Denies chest pain. Respiratory: Denies shortness of breath. Gastrointestinal: No abdominal pain.  No nausea, no vomiting.  No diarrhea.   Genitourinary: Negative for dysuria. Musculoskeletal: Negative for back pain. Skin: Negative for rash. Neurological: Positive for headache. ____________________________________________   PHYSICAL EXAM:  VITAL SIGNS: ED Triage Vitals  Enc   Vitals Group     BP 12/20/19 1427 (!) 152/81     Pulse Rate 12/20/19 1427 75     Resp 12/20/19 1427 18     Temp 12/20/19 1429 98.3 F (36.8 C)     Temp Source 12/20/19 1429 Oral     SpO2 12/20/19 1427 93 %     Weight 12/20/19 1427 250 lb (113.4 kg)     Height 12/20/19 1427 5' 6" (1.676 m)     Head Circumference --      Peak Flow --      Pain Score 12/20/19 1427 9   Constitutional: Alert and oriented.  Eyes: Conjunctivae are normal.  ENT      Head: Normocephalic and atraumatic.      Nose: No congestion/rhinnorhea.      Mouth/Throat: Mucous membranes are moist.      Neck: No stridor. Hematological/Lymphatic/Immunilogical: No cervical lymphadenopathy. Cardiovascular: Normal rate, regular rhythm.  No murmurs, rubs, or gallops.  Respiratory: Normal respiratory effort without tachypnea nor retractions. Breath sounds are clear and equal bilaterally. No wheezes/rales/rhonchi. Gastrointestinal: Soft and non tender. No rebound. No guarding.  Genitourinary: Deferred Musculoskeletal:  Normal range of motion in all extremities. No lower extremity edema. Neurologic:  Normal speech and language. No gross focal neurologic deficits are appreciated.  Skin:  Skin is warm, dry and intact. No rash noted. Psychiatric: Mood and affect are normal. Speech and behavior are normal. Patient exhibits appropriate insight and judgment.  ____________________________________________    LABS (pertinent positives/negatives)  BMP wnl CBC wbc 9.7, hgb 13.9, plt 399  ____________________________________________   EKG  I, Nance Pear, attending physician, personally viewed and interpreted this EKG  EKG Time: 1436 Rate: 80 Rhythm: sinus rhythm Axis: normal Intervals: qtc 416 QRS: narrow, q waves v1, v2 ST changes: no st elevation Impression: abnormal ekg  ____________________________________________    RADIOLOGY  CT head No acute findings. Partially empty sella turcica.   ____________________________________________   PROCEDURES  Procedures  ____________________________________________   INITIAL IMPRESSION / ASSESSMENT AND PLAN / ED COURSE  Pertinent labs & imaging results that were available during my care of the patient were reviewed by me and considered in my medical decision making (see chart for details).   Patient presented to the emergency department today because of concerns for possible seizure and feeling unwell.  Patient states she last had a seizure 12 years ago.  On exam patient is awake and alert.  Blood work without concerning electrolyte abnormalities.  Head CT did not show any acute findings.  Did show partially empty sella turcica.  I discussed this finding with the patient.  Discussed with patient importance of following up with neurology.  Patient's headache did resolve with medication.  Discussed seizure precautions.  ____________________________________________   FINAL CLINICAL IMPRESSION(S) / ED DIAGNOSES  Final diagnoses:  Seizure-like  activity ([place])     Note: This dictation was prepared with Dragon dictation. Any transcriptional errors that result from this process are unintentional     Nance Pear, MD 12/20/19 1851

## 2020-01-08 ENCOUNTER — Other Ambulatory Visit: Payer: Self-pay | Admitting: Acute Care

## 2020-01-08 ENCOUNTER — Other Ambulatory Visit (HOSPITAL_COMMUNITY): Payer: Self-pay | Admitting: Acute Care

## 2020-01-08 DIAGNOSIS — R569 Unspecified convulsions: Secondary | ICD-10-CM

## 2020-01-10 ENCOUNTER — Ambulatory Visit (INDEPENDENT_AMBULATORY_CARE_PROVIDER_SITE_OTHER): Payer: Medicaid Other | Admitting: Adult Health

## 2020-01-10 ENCOUNTER — Other Ambulatory Visit: Payer: Self-pay

## 2020-01-10 ENCOUNTER — Encounter: Payer: Self-pay | Admitting: Adult Health

## 2020-01-10 VITALS — BP 112/76 | HR 80 | Temp 97.3°F | Ht 66.0 in | Wt 278.6 lb

## 2020-01-10 DIAGNOSIS — E669 Obesity, unspecified: Secondary | ICD-10-CM | POA: Diagnosis not present

## 2020-01-10 DIAGNOSIS — G4733 Obstructive sleep apnea (adult) (pediatric): Secondary | ICD-10-CM | POA: Insufficient documentation

## 2020-01-10 NOTE — Assessment & Plan Note (Signed)
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  Unfortunately patient has had a break in therapy and will require a repeat in lab sleep study due to her insurance. Patient has significant symptom burden with snoring, daytime sleepiness.  Epworth is at 13.  Patient has significant improvement on nocturnal CPAP.  We will proceed with an in lab sleep study split-night. We discussed patient education on obstructive sleep apnea and CPAP usage.  Discussed a healthy sleep regimen.  Advised on decreased caffeine intake.  And work on healthy weight loss. We went over the potential cardiovascular complications of untreated sleep apnea   Plan  Patient Instructions  Set up for split night sleep study  Healthy sleep regimen .  Work on healthy weight loss.  Follow up in 3 months with Dr. Mortimer Fries or APP and As needed

## 2020-01-10 NOTE — Progress Notes (Signed)
@Patient  ID: Colleen Grimes, female    DOB: April 20, 1980, 39 y.o.   MRN: [id number]  Chief Complaint  Patient presents with  . Consult    Daytime sleepiness    Referring provider: [place], [facility]  HPI: 39 year old female presents for a sleep consult January 10, 2020 for sleep apnea Had COVID-19 infection August 2021  TEST/EVENTS :   01/10/2020 Sleep consult  Patient presents for a sleep consult.  Patient says she has known sleep apnea.  Was previously diagnosed at [facility] with sleep apnea around 2020.  She was having daytime sleepiness and restless sleep.  A sleep study was done that showed that she had mild sleep apnea. Patient has a known history of obstructive sleep apnea.  Was seen by [facility] and a sleep study was done on April 26, 2018 that showed an overall AHI at 12/hour.,  REM AHI 26/hour and supine AHI of 14/hour.  Patient underwent a titration study that showed optimal CPAP pressure at 7 cm H2O.  With an AHI at 0/hour.  Says she responded well to a 4 to 8 cm H2O. She was started on nocturnal CPAP.  Said she wore CPAP all night each night felt much better. However Insurance would not pay for machine and had to turn back in . Wants to get back on CPAP because she felt so much better on machine.   Epworth score 12 Patient typically goes to bed between 10-11 PM.  Takes about 15 to 20 minutes to go to sleep.  Wakes up about 3 times throughout the night.  And gets out of bed at about 6:30 AM.  Patient has noted that her weight has trended up over the last 2 years about 40 pounds. Daytime sleepiness, severe snoring, restless sleep, fatigue, low energy, poor concentration, weight gain, Trouble staying awake with sitting down, watching TV. Would nap each day.  Drinks sweet tea, 4 glasses day. No coffee. 1 soda a day .  No symptoms of sleep paralysis, cataplexy, or narcolepsy.   SH:  Works Scientist, research (medical)  Former smoker,  Has dog/cat Never in [employer] .    Allergies  Allergen Reactions  . Dilantin [Phenytoin Sodium Extended] Other (See Comments)    Pt can not recall reaction    Immunization History  Administered Date(s) Administered  . Influenza,inj,Quad PF,6+ Mos 03/10/2017, 01/20/2018, 01/01/2019  . Influenza-Unspecified 03/07/2017, 01/20/2018, 10/26/2018  . Tdap 12/23/2015    Past Medical History:  Diagnosis Date  . Acid reflux   . Anemia   . Asthma   . BRCA negative 03/2017   MyRisk neg  . Difficult intubation   . Dyspnea    occasionally  . Epilepsy ([place])   . Family history of ovarian cancer 03/2017   MyRisk neg  . Heart murmur    tricuspid valve "leaks"  . Hypothyroidism   . Pneumonia   . Seizures ([place])    no seizure in several years   Surgical hx  Past Surgical History:  Procedure Laterality Date  . CESAREAN SECTION  x 3  . HYSTERECTOMY 04/17/2019  [facility] right salpingectomy extensive abdominal and pelvic adhesiolysis  . [facility] right salpingectomy extensive abdominal and pelvic adhesiolysis  . TONSILLECTOMY February 1997  . TUBAL LIGATION 2007, 2011    FAMILY HISTORY Family History  Problem Relation Age of Onset  . Alcohol abuse Mother  . Bipolar disorder Mother  . Ovarian cancer Mother  died at 22  . Asthma Mother  .  Cancer Mother  . Alcohol abuse Father  . Bipolar disorder Brother  . Osteoarthritis Brother  . Deep vein thrombosis (DVT or abnormal blood clot formation) Paternal Grandmother     Tobacco History: Social History   Tobacco Use  Smoking Status Former Smoker  . Quit date: 04/11/2010  . Years since quitting: 9.7  Smokeless Tobacco Never Used   Counseling given: Not Answered   Outpatient Medications Prior to Visit  Medication Sig Dispense Refill  . albuterol (VENTOLIN HFA) 108 (90 Base) MCG/ACT inhaler Inhale 2 puffs into the lungs every 6 (six) hours as needed for wheezing or shortness of breath. 18 g 0  . ALPRAZolam (XANAX) 0.5 MG tablet TAKE 1 TABLET BY MOUTH ONCE DAILY AS  NEEDED FOR  ANXIETY    . buPROPion (WELLBUTRIN XL) 150 MG 24 hr tablet Take 300 mg by mouth daily.    . busPIRone (BUSPAR) 10 MG tablet Take 10 mg by mouth 2 (two) times daily.    Marland Kitchen escitalopram (LEXAPRO) 20 MG tablet Take 20 mg by mouth daily.     . Ipratropium-Albuterol (COMBIVENT) 20-100 MCG/ACT AERS respimat Inhale 1 puff into the lungs every 6 (six) hours as needed for wheezing or shortness of breath.     . levothyroxine (SYNTHROID) 50 MCG tablet Take 50 mcg by mouth daily before breakfast.    . omeprazole (PRILOSEC) 40 MG capsule Take 40 mg by mouth daily.     . benzonatate (TESSALON) 100 MG capsule Take 1 capsule (100 mg total) by mouth every 8 (eight) hours. (Patient not taking: Reported on 01/10/2020) 21 capsule 0  . clonazePAM (KLONOPIN) 0.5 MG tablet Take 0.5 mg by mouth daily as needed for anxiety. (Patient not taking: Reported on 01/10/2020)    . ergocalciferol (VITAMIN D2) 1.25 MG (50000 UT) capsule Take 50,000 Units by mouth once a week. (Patient not taking: Reported on 01/10/2020)    . ferrous sulfate 325 (65 FE) MG tablet Take 325 mg by mouth daily with breakfast. (Patient not taking: Reported on 01/10/2020)    . Iron-Vitamin C 65-125 MG TABS 1 tablet daily x 1 week, then increase to 1 tablet twice a day. (Patient not taking: Reported on 01/10/2020) 60 tablet 1  . meloxicam (MOBIC) 15 MG tablet Take 1 tablet (15 mg total) by mouth daily as needed for pain. (Patient not taking: Reported on 01/10/2020) 14 tablet 0  . vitamin B-12 (CYANOCOBALAMIN) 1000 MCG tablet Take 1,000 mcg by mouth daily. (Patient not taking: Reported on 01/10/2020)     No facility-administered medications prior to visit.     Review of Systems:   Constitutional:   No  weight loss, night sweats,  Fevers, chills, + fatigue, or  lassitude.  HEENT:   No headaches,  Difficulty swallowing,  Tooth/dental problems, or  Sore throat,                No sneezing, itching, ear ache, nasal congestion, post nasal drip,     CV:  No chest pain,  Orthopnea, PND, swelling in lower extremities, anasarca, dizziness, palpitations, syncope.   GI  No heartburn, indigestion, abdominal pain, nausea, vomiting, diarrhea, change in bowel habits, loss of appetite, bloody stools.   Resp: No shortness of breath with exertion or at rest.  No excess mucus, no productive cough,  No non-productive cough,  No coughing up of blood.  No change in color of mucus.  No wheezing.  No chest wall deformity  Skin: no rash or lesions.  GU:  no dysuria, change in color of urine, no urgency or frequency.  No flank pain, no hematuria   MS:  No joint pain or swelling.  No decreased range of motion.  No back pain.    Physical Exam  BP 112/76 (BP Location: Left Wrist, Patient Position: Sitting, Cuff Size: Normal)   Pulse 80   Temp (!) 97.3 F (36.3 C) (Temporal)   Ht 5' 6"  (1.676 m)   Wt 278 lb 9.6 oz (126.4 kg)   LMP 03/22/2019   SpO2 96%   BMI 44.97 kg/m   GEN: A/Ox3; pleasant , NAD, well nourished    HEENT:  [place]/AT,  NOSE-clear, THROAT-clear, no lesions, no postnasal drip or exudate noted. Class 2-3 MP airway   NECK:  Supple w/ fair ROM; no JVD; normal carotid impulses w/o bruits; no thyromegaly or nodules palpated; no lymphadenopathy.    RESP  Clear  P & A; w/o, wheezes/ rales/ or rhonchi. no accessory muscle use, no dullness to percussion  CARD:  RRR, no m/r/g, no peripheral edema, pulses intact, no cyanosis or clubbing.  GI:   Soft & nt; nml bowel sounds; no organomegaly or masses detected.   Musco: Warm bil, no deformities or joint swelling noted.   Neuro: alert, no focal deficits noted.    Skin: Warm, no lesions or rashes    Lab Results:  CBC  BMET  BNP No results found for: BNP  ProBNP No results found for: PROBNP  Imaging:     No flowsheet data found.  No results found for: NITRICOXIDE      Assessment & Plan:   OSA (obstructive sleep apnea) Mild obstructive sleep apnea previously  diagnosed on sleep study and 2020.  Unfortunately patient has had a break in therapy and will require a repeat in lab sleep study due to her insurance. Patient has significant symptom burden with snoring, daytime sleepiness.  Epworth is at 13.  Patient has significant improvement on nocturnal CPAP.  We will proceed with an in lab sleep study split-night. We discussed patient education on obstructive sleep apnea and CPAP usage.  Discussed a healthy sleep regimen.  Advised on decreased caffeine intake.  And work on healthy weight loss. We went over the potential cardiovascular complications of untreated sleep apnea   Plan  Patient Instructions  Set up for split night sleep study  Healthy sleep regimen .  Work on healthy weight loss.  Follow up in 3 months with Dr. Mortimer Fries or APP and As needed        Mild obesity Healthy weight loss encouraged     Rexene Edison, NP 01/10/2020

## 2020-01-10 NOTE — Patient Instructions (Addendum)
Set up for split night sleep study  Healthy sleep regimen .  Work on healthy weight loss.  Follow up in 3 months with Dr. Mortimer Fries or APP and As needed

## 2020-01-10 NOTE — Assessment & Plan Note (Signed)
Healthy weight loss encouraged 

## 2020-01-10 NOTE — Progress Notes (Signed)
Reviewed and agree with assessment/plan.   Chesley Mires, MD [facility] Pulmonary/Critical Care 01/10/2020, 7:17 PM Pager:  [phone]

## 2020-01-11 ENCOUNTER — Ambulatory Visit: Payer: Medicaid Other

## 2020-01-24 ENCOUNTER — Ambulatory Visit
Admission: RE | Admit: 2020-01-24 | Discharge: 2020-01-24 | Disposition: A | Discharge status: Home / Self Care | Payer: Medicaid Other | Source: Ambulatory Visit | Attending: Acute Care | Admitting: Acute Care

## 2020-01-24 ENCOUNTER — Other Ambulatory Visit: Payer: Self-pay

## 2020-01-24 DIAGNOSIS — R569 Unspecified convulsions: Secondary | ICD-10-CM | POA: Diagnosis present

## 2020-01-31 ENCOUNTER — Ambulatory Visit: Payer: Medicaid Other | Attending: Pulmonary Disease

## 2020-01-31 DIAGNOSIS — Z6841 Body Mass Index (BMI) 40.0 and over, adult: Secondary | ICD-10-CM | POA: Insufficient documentation

## 2020-01-31 DIAGNOSIS — R400 Somnolence: Secondary | ICD-10-CM | POA: Diagnosis present

## 2020-01-31 DIAGNOSIS — G4733 Obstructive sleep apnea (adult) (pediatric): Secondary | ICD-10-CM | POA: Diagnosis not present

## 2020-02-01 ENCOUNTER — Other Ambulatory Visit: Payer: Self-pay

## 2020-02-06 ENCOUNTER — Telehealth (INDEPENDENT_AMBULATORY_CARE_PROVIDER_SITE_OTHER): Payer: Medicaid Other | Admitting: Pulmonary Disease

## 2020-02-06 DIAGNOSIS — R0683 Snoring: Secondary | ICD-10-CM

## 2020-02-06 NOTE — Telephone Encounter (Signed)
No significant OSA on this study No indication for CPAP therapy based on this study

## 2020-02-07 NOTE — Telephone Encounter (Signed)
Spoke with the pt and scheduled for televisit with TP for 02/11/20 at 2 pm.

## 2020-02-07 NOTE — Telephone Encounter (Signed)
Please set up televisit with patient to discuss sleep study results  Please use any spots Mon/Tues/Wed next week .

## 2020-02-11 ENCOUNTER — Other Ambulatory Visit: Payer: Self-pay

## 2020-02-11 ENCOUNTER — Ambulatory Visit (INDEPENDENT_AMBULATORY_CARE_PROVIDER_SITE_OTHER): Payer: Medicaid Other | Admitting: Adult Health

## 2020-02-11 ENCOUNTER — Encounter: Payer: Self-pay | Admitting: Adult Health

## 2020-02-11 DIAGNOSIS — R0683 Snoring: Secondary | ICD-10-CM | POA: Diagnosis not present

## 2020-02-11 DIAGNOSIS — E669 Obesity, unspecified: Secondary | ICD-10-CM | POA: Diagnosis not present

## 2020-02-11 DIAGNOSIS — G4710 Hypersomnia, unspecified: Secondary | ICD-10-CM

## 2020-02-11 NOTE — Patient Instructions (Signed)
Healthy sleep regimen .  Work on healthy weight loss.  Follow up with Dr. Mortimer Fries  As needed.

## 2020-02-11 NOTE — Progress Notes (Signed)
Virtual Visit via Telephone Note  I connected with Colleen TENNISON on 02/11/20 at  2:00 PM EST by telephone and verified that I am speaking with the correct person using two identifiers.  Location: Patient: Home   Provider: Home Office    I discussed the limitations, risks, security and privacy concerns of performing an evaluation and management service by telephone and the availability of in person appointments. I also discussed with the patient that there may be a patient responsible charge related to this service. The patient expressed understanding and agreed to proceed.   History of Present Illness: 39 year old female seen for sleep consult January 10, 2020 for daytime sleepiness and restless sleep. She had previously been diagnosed with mild sleep apnea with a sleep study done at [facility] February 2020 with a AHI at 12/hour.  She had some difficulties with CPAP and insurance would not cover so she turned her CPAP machine back in. Last visit patient had Epworth score at 12.  She was set up for a repeat sleep study due to ongoing daytime sleepiness and restless sleep.  Along with low energy and snoring. She was set up for an overnight sleep study that was done on January 31, 2020.  This showed no significant sleep apnea with a total AHI at 3.4/hour. We went over her sleep study results.  Discussed a healthy sleep hygiene regimen.  We also discussed healthy weight loss.      Past Medical History:  Diagnosis Date  . Acid reflux   . Anemia   . Asthma   . BRCA negative 03/2017   MyRisk neg  . Difficult intubation   . Dyspnea    occasionally  . Epilepsy ([place])   . Family history of ovarian cancer 03/2017   MyRisk neg  . Heart murmur    tricuspid valve "leaks"  . Hypothyroidism   . Pneumonia   . Seizures ([place])    no seizure in several years   Current Outpatient Medications on File Prior to Visit  Medication Sig Dispense Refill  . albuterol (VENTOLIN HFA) 108 (90  Base) MCG/ACT inhaler Inhale 2 puffs into the lungs every 6 (six) hours as needed for wheezing or shortness of breath. 18 g 0  . ALPRAZolam (XANAX) 0.5 MG tablet TAKE 1 TABLET BY MOUTH ONCE DAILY AS NEEDED FOR  ANXIETY    . benzonatate (TESSALON) 100 MG capsule Take 1 capsule (100 mg total) by mouth every 8 (eight) hours. (Patient not taking: Reported on 01/10/2020) 21 capsule 0  . buPROPion (WELLBUTRIN XL) 150 MG 24 hr tablet Take 300 mg by mouth daily.    . busPIRone (BUSPAR) 10 MG tablet Take 10 mg by mouth 2 (two) times daily.    . clonazePAM (KLONOPIN) 0.5 MG tablet Take 0.5 mg by mouth daily as needed for anxiety. (Patient not taking: Reported on 01/10/2020)    . ergocalciferol (VITAMIN D2) 1.25 MG (50000 UT) capsule Take 50,000 Units by mouth once a week. (Patient not taking: Reported on 01/10/2020)    . escitalopram (LEXAPRO) 20 MG tablet Take 20 mg by mouth daily.     . ferrous sulfate 325 (65 FE) MG tablet Take 325 mg by mouth daily with breakfast. (Patient not taking: Reported on 01/10/2020)    . Ipratropium-Albuterol (COMBIVENT) 20-100 MCG/ACT AERS respimat Inhale 1 puff into the lungs every 6 (six) hours as needed for wheezing or shortness of breath.     . Iron-Vitamin C 65-125 MG TABS 1 tablet  daily x 1 week, then increase to 1 tablet twice a day. (Patient not taking: Reported on 01/10/2020) 60 tablet 1  . levothyroxine (SYNTHROID) 50 MCG tablet Take 50 mcg by mouth daily before breakfast.    . meloxicam (MOBIC) 15 MG tablet Take 1 tablet (15 mg total) by mouth daily as needed for pain. (Patient not taking: Reported on 01/10/2020) 14 tablet 0  . omeprazole (PRILOSEC) 40 MG capsule Take 40 mg by mouth daily.     . vitamin B-12 (CYANOCOBALAMIN) 1000 MCG tablet Take 1,000 mcg by mouth daily. (Patient not taking: Reported on 01/10/2020)     No current facility-administered medications on file prior to visit.    Observations/Objective: Speaks in full sentences with no audible  distress  Assessment and Plan: Daytime hypersomnolence-repeat sleep study showed no significant sleep apnea. We discussed a healthy sleep regimen.  Advised to avoid sedating medications as able. Discussed nasal snore strips to help with nighttime snoring.  Also discussed oral appliance for snoring.  She wants to hold off for referral at this time.  Obesity ,discussed healthy weight loss.   Plan  Patient Instructions  Healthy sleep regimen .  Work on healthy weight loss.  Follow up with Dr. Mortimer Fries  As needed.        Follow Up Instructions: As needed.    I discussed the assessment and treatment plan with the patient. The patient was provided an opportunity to ask questions and all were answered. The patient agreed with the plan and demonstrated an understanding of the instructions.   The patient was advised to call back or seek an in-person evaluation if the symptoms worsen or if the condition fails to improve as anticipated.  I provided 15  minutes of non-face-to-face time during this encounter.   Rexene Edison, NP

## 2020-02-26 ENCOUNTER — Ambulatory Visit: Payer: Medicaid Other | Admitting: Adult Health

## 2020-08-22 ENCOUNTER — Encounter: Payer: Self-pay | Admitting: Hematology and Oncology

## 2020-08-22 ENCOUNTER — Ambulatory Visit
Admission: EM | Admit: 2020-08-22 | Discharge: 2020-08-22 | Disposition: A | Discharge status: Home / Self Care | Payer: Medicaid Other | Attending: Emergency Medicine | Admitting: Emergency Medicine

## 2020-08-22 ENCOUNTER — Ambulatory Visit (INDEPENDENT_AMBULATORY_CARE_PROVIDER_SITE_OTHER): Payer: Medicaid Other

## 2020-08-22 ENCOUNTER — Encounter: Payer: Self-pay | Admitting: Emergency Medicine

## 2020-08-22 ENCOUNTER — Other Ambulatory Visit: Payer: Self-pay

## 2020-08-22 DIAGNOSIS — M25559 Pain in unspecified hip: Secondary | ICD-10-CM

## 2020-08-22 DIAGNOSIS — M25551 Pain in right hip: Secondary | ICD-10-CM

## 2020-08-22 MED ORDER — MELOXICAM 15 MG PO TABS: 15.0000 mg | ORAL_TABLET | Freq: Every day | ORAL | 0 refills | Status: AC

## 2020-08-22 MED ORDER — KETOROLAC TROMETHAMINE 60 MG/2ML IM SOLN
60.0000 mg | Freq: Once | INTRAMUSCULAR | Status: AC
  Administered 2020-08-22: 60 mg via INTRAMUSCULAR

## 2020-08-22 NOTE — ED Provider Notes (Signed)
[facility]    CSN: [id number] Arrival date & time: 08/22/20  1420      History   Chief Complaint Chief Complaint  Patient presents with  . Hip Pain    right    HPI Colleen Grimes is a 40 y.o. female.   Colleen Grimes presents with complaints of right anterior hip pain which started yesterday and is worse today. Certain movements trigger it such as raising from sit to stand. Weight bearing increases pain. No specific numbness or tingling. No known injury. 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 Took tylenol and advil this morning which haven't helped. Denies any previous similar. No back pain. No previous orthopedic injuries. No abdominal pain or urinary symptoms.    ROS per HPI, negative if not otherwise mentioned.       Past Medical History:  Diagnosis Date  . Acid reflux   . Anemia   . Asthma   . BRCA negative 03/2017   MyRisk neg  . Difficult intubation   . Dyspnea    occasionally  . Epilepsy ([place])   . Family history of ovarian cancer 03/2017   MyRisk neg  . Heart murmur    tricuspid valve "leaks"  . Hypothyroidism   . Pneumonia   . Seizures ([place])    no seizure in several years    Patient Active Problem List   Diagnosis Date Noted  . OSA (obstructive sleep apnea) 01/10/2020  . Abnormal radionuclide bone scan 03/27/2018  . Elevated alkaline phosphatase level 03/18/2018  . Bone pain 03/09/2018  . Menorrhagia with regular cycle 03/09/2018  . Low vitamin B12 level 03/09/2018  . Iron deficiency anemia 03/01/2018  . Chest pain on exertion 01/26/2018  . Malaise and fatigue 01/26/2018  . Shortness of breath on exertion 01/26/2018  . Snoring 01/26/2018  . Acute respiratory distress 06/24/2017  . Community acquired pneumonia 06/09/2017  . BRCA negative 04/04/2017  . Right foot pain 11/10/2016  . Bursitis of left elbow 07/26/2016  . Fibromyalgia muscle pain 01/26/2016  . Muscle strain 01/26/2016  . Vitamin D deficiency 01/26/2016  .  Heartburn   . Acquired hypothyroidism 12/23/2015  . Anxiety state 12/23/2015  . Chronic fatigue, unspecified 12/15/2015  . Mild obesity 12/15/2015  . Multiple joint pain 12/15/2015  . GERD (gastroesophageal reflux disease) 12/09/2015  . Seizures ([place]) 12/09/2015    Past Surgical History:  Procedure Laterality Date  . ABDOMINAL HYSTERECTOMY    . CESAREAN SECTION    . CYSTOSCOPY  04/17/2019   Procedure: CYSTOSCOPY;  Surgeon: Schermerhorn, Gwen Her, MD;  Location: [facility];  Service: Gynecology;;  . ESOPHAGOGASTRODUODENOSCOPY (EGD) WITH PROPOFOL N/A 01/02/2016   Procedure: ESOPHAGOGASTRODUODENOSCOPY (EGD) WITH PROPOFOL;  Surgeon: Lucilla Lame, MD;  Location: [place];  Service: Endoscopy;  Laterality: N/A;  . LAPAROSCOPIC LYSIS OF ADHESIONS  04/17/2019   Procedure: LAPAROSCOPIC LYSIS OF ADHESIONS;  Surgeon: Ouida Sills, Gwen Her, MD;  Location: [facility];  Service: Gynecology;;  . LAPAROSCOPIC SUPRACERVICAL HYSTERECTOMY N/A 04/17/2019   Procedure: LAPAROSCOPIC SUPRACERVICAL HYSTERECTOMY;  Surgeon: Schermerhorn, Gwen Her, MD;  Location: [facility];  Service: Gynecology;  Laterality: N/A;  . LAPAROSCOPIC UNILATERAL SALPINGECTOMY Right 04/17/2019   Procedure: LAPAROSCOPIC UNILATERAL SALPINGECTOMY;  Surgeon: Schermerhorn, Gwen Her, MD;  Location: [facility];  Service: Gynecology;  Laterality: Right;  . TONSILLECTOMY    . TUBAL LIGATION      OB History    Gravida  3   Para  1   Term  1   Preterm      AB      Living  3     SAB      IAB      Ectopic      Multiple      Live Births  3            Home Medications    Prior to Admission medications   Medication Sig Start Date End Date Taking? Authorizing Provider  busPIRone (BUSPAR) 10 MG tablet Take 10 mg by mouth 2 (two) times daily.   Yes [provider]  escitalopram (LEXAPRO) 20 MG tablet Take 20 mg by mouth daily.  12/04/15 08/22/20 Yes [provider]  levothyroxine (SYNTHROID) 50 MCG tablet  Take 50 mcg by mouth daily before breakfast.   Yes [provider]  meloxicam (MOBIC) 15 MG tablet Take 1 tablet (15 mg total) by mouth daily. 08/22/20  Yes Augusto Gamble B, NP  omeprazole (PRILOSEC) 40 MG capsule Take 40 mg by mouth daily.    Yes [provider]  albuterol (VENTOLIN HFA) 108 (90 Base) MCG/ACT inhaler Inhale 2 puffs into the lungs every 6 (six) hours as needed for wheezing or shortness of breath. 11/12/19 09/18/21  Lamptey, Myrene Galas, MD  ALPRAZolam Duanne Moron) 0.5 MG tablet TAKE 1 TABLET BY MOUTH ONCE DAILY AS NEEDED FOR  ANXIETY 07/23/19   [provider]  benzonatate (TESSALON) 100 MG capsule Take 1 capsule (100 mg total) by mouth every 8 (eight) hours. Patient not taking: No sig reported 11/12/19   Chase Picket, MD  buPROPion (WELLBUTRIN XL) 150 MG 24 hr tablet Take 300 mg by mouth daily. 02/24/19   [provider]  clonazePAM (KLONOPIN) 0.5 MG tablet Take 0.5 mg by mouth daily as needed for anxiety. Patient not taking: Reported on 01/10/2020 06/07/18   [provider]  ergocalciferol (VITAMIN D2) 1.25 MG (50000 UT) capsule Take 50,000 Units by mouth once a week. Patient not taking: Reported on 01/10/2020    [provider]  ferrous sulfate 325 (65 FE) MG tablet Take 325 mg by mouth daily with breakfast. Patient not taking: Reported on 01/10/2020    [provider]  Ipratropium-Albuterol (COMBIVENT) 20-100 MCG/ACT AERS respimat Inhale 1 puff into the lungs every 6 (six) hours as needed for wheezing or shortness of breath.  01/20/18   [provider]  Iron-Vitamin C 65-125 MG TABS 1 tablet daily x 1 week, then increase to 1 tablet twice a day. Patient not taking: Reported on 01/10/2020 03/01/18   Karen Kitchens, NP  vitamin B-12 (CYANOCOBALAMIN) 1000 MCG tablet Take 1,000 mcg by mouth daily. Patient not taking: Reported on 01/10/2020    [provider]    Family History Family History  Problem Relation  Age of Onset  . Ovarian cancer Mother   . Vaginal cancer Mother   . Cancer Mother   . Breast cancer Paternal Aunt   . Diabetes Paternal Grandmother   . Diabetes Paternal Grandfather   . Breast cancer Paternal Aunt   . Colon cancer Neg Hx   . Heart disease Neg Hx     Social History Social History   Tobacco Use  . Smoking status: Former Smoker    Quit date: 04/11/2010    Years since quitting: 10.3  . Smokeless tobacco: Never Used  Vaping Use  . Vaping Use: Never used  Substance Use Topics  . Alcohol use: No  . Drug use: No  Allergies   Dilantin [phenytoin sodium extended]   Review of Systems Review of Systems   Physical Exam Triage Vital Signs ED Triage Vitals  Enc Vitals Group     BP 08/22/20 1434 124/74     Pulse Rate 08/22/20 1434 70     Resp 08/22/20 1434 14     Temp 08/22/20 1434 97.8 F (36.6 C)     Temp Source 08/22/20 1434 Oral     SpO2 08/22/20 1434 99 %     Weight 08/22/20 1431 270 lb (122.5 kg)     Height 08/22/20 1431 5' 6"  (1.676 m)     Head Circumference --      Peak Flow --      Pain Score 08/22/20 1430 4     Pain Loc --      Pain Edu? --      Excl. in [place]? --    No data found.  Updated Vital Signs BP 124/74 (BP Location: Left Arm)   Pulse 70   Temp 97.8 F (36.6 C) (Oral)   Resp 14   Ht 5' 6"  (1.676 m)   Wt 270 lb (122.5 kg)   LMP 03/22/2019   SpO2 99%   BMI 43.58 kg/m   Visual Acuity Right Eye Distance:   Left Eye Distance:   Bilateral Distance:    Right Eye Near:   Left Eye Near:    Bilateral Near:     Physical Exam Constitutional:      General: She is not in acute distress.    Appearance: She is well-developed.  Cardiovascular:     Rate and Rhythm: Normal rate.  Pulmonary:     Effort: Pulmonary effort is normal.  Musculoskeletal:     Right hip: Tenderness and bony tenderness present. Normal range of motion. Normal strength.       Legs:     Comments: Point tenderness to right anterior hip on palpation; no  femur tenderness; no pain with passive ROM; pain with transition from sit to lay and lay to sit; sensation intact; ambulatory  Skin:    General: Skin is warm and dry.  Neurological:     Mental Status: She is alert and oriented to person, place, and time.      UC Treatments / Results  Labs (all labs ordered are listed, but only abnormal results are displayed) Labs Reviewed - No data to display  EKG   Radiology DG Hip Unilat With Pelvis 2-3 Views Right  Result Date: 08/22/2020 CLINICAL DATA:  Acute right hip pain without known injury. EXAM: DG HIP (WITH OR WITHOUT PELVIS) 2-3V RIGHT COMPARISON:  None. FINDINGS: There is no evidence of hip fracture or dislocation. There is no evidence of arthropathy or other focal bone abnormality. IMPRESSION: Negative. Electronically Signed   By: Marijo Conception M.D.   On: 08/22/2020 15:49    Procedures Procedures (including critical care time)  Medications Ordered in UC Medications  ketorolac (TORADOL) injection 60 mg (60 mg Intramuscular Given 08/22/20 1602)    Initial Impression / Assessment and Plan / UC Course  I have reviewed the triage vital signs and the nursing notes.  Pertinent labs & imaging results that were available during my care of the patient were reviewed by me and considered in my medical decision making (see chart for details).     Acute onset of right hip pain without injury, xray without acute findings. Strain vs arthritis considered. Pain management and expected course of rehab discussed. Follow  up recommendations provided. Patient verbalized understanding and agreeable to plan.   Final Clinical Impressions(s) / UC Diagnoses   Final diagnoses:  Hip pain  Right hip pain     Discharge Instructions     Your xray is normal today which is reassuring.  Strain vs arthritis considered today.  Activity as tolerated.  I am hopeful that the medication given today helps with pain.  Meloxicam daily. Don't take additional  ibuprofen for aleve. Take with food. Start this tomorrow.  Follow up with orthopedics as you may need further evaluation or management if it persists.     ED Prescriptions    Medication Sig Dispense Auth. Provider   meloxicam (MOBIC) 15 MG tablet Take 1 tablet (15 mg total) by mouth daily. 20 tablet Zigmund Gottron, NP     PDMP not reviewed this encounter.   Zigmund Gottron, NP 08/22/20 [phone]

## 2020-08-22 NOTE — ED Triage Notes (Signed)
Patient c/o right hip pain that started yesterday.  Patient denies fall or injury.

## 2020-08-22 NOTE — Discharge Instructions (Signed)
Your xray is normal today which is reassuring.  Strain vs arthritis considered today.  Activity as tolerated.  I am hopeful that the medication given today helps with pain.  Meloxicam daily. Don't take additional ibuprofen for aleve. Take with food. Start this tomorrow.  Follow up with orthopedics as you may need further evaluation or management if it persists.

## 2021-01-27 ENCOUNTER — Ambulatory Visit
Admission: EM | Admit: 2021-01-27 | Discharge: 2021-01-27 | Disposition: A | Discharge status: Home / Self Care | Payer: Medicaid Other | Attending: Internal Medicine | Admitting: Internal Medicine

## 2021-01-27 ENCOUNTER — Other Ambulatory Visit: Payer: Self-pay

## 2021-01-27 DIAGNOSIS — J209 Acute bronchitis, unspecified: Secondary | ICD-10-CM | POA: Diagnosis not present

## 2021-01-27 DIAGNOSIS — J42 Unspecified chronic bronchitis: Secondary | ICD-10-CM | POA: Diagnosis not present

## 2021-01-27 MED ORDER — AZITHROMYCIN 250 MG PO TABS: ORAL_TABLET | ORAL | 0 refills | Status: AC

## 2021-01-27 MED ORDER — ALBUTEROL SULFATE (2.5 MG/3ML) 0.083% IN NEBU: 2.5000 mg | INHALATION_SOLUTION | Freq: Four times a day (QID) | RESPIRATORY_TRACT | 0 refills | Status: AC | PRN

## 2021-01-27 MED ORDER — PREDNISONE 50 MG PO TABS: 50.0000 mg | ORAL_TABLET | Freq: Every day | ORAL | 0 refills | Status: AC

## 2021-01-27 NOTE — Discharge Instructions (Signed)
Symptoms today seem most consistent with acute episode of chronic bronchitis, probably triggered by a viral infection.  Prescriptions for albuterol nebs and prednisone (for irritated airways) sent to pharmacy.  Prescription for zithromax, to start 11/13 if productive cough not improving, also sent to pharmacy.  Note for work tomorrow.  Anticipate gradual improvement over the next several days as the virus runs its course.  Recheck or followup with your primary care provider for new fever >100.5, increasing phlegm production/nasal discharge, or if not starting to improve in the expected time course.

## 2021-01-27 NOTE — ED Triage Notes (Signed)
Pt c/o shortness of breath, cough x 2 days. Nasal congestion, pressure behind eyes. Pt has tried inhaler and did not work, rescue helped some. Pt is wheezing as I get vitals

## 2021-01-27 NOTE — ED Provider Notes (Signed)
[facility]    CSN: [id number] Arrival date & time: 01/27/21  1306      History   Chief Complaint Chief Complaint  Patient presents with   Cough    HPI Colleen Grimes is a 40 y.o. female.  She has a history of chronic bronchitis, and current symptoms remind her of this.  She started coughing a couple of days ago, is having a lot of easily triggered coughing, it is productive, and she has chest congestion.  She feels a little bit of breathlessness.  She is using her albuterol inhaler and her Combivent inhaler.  Her flanks are sore from coughing.  No fever.  Nose is runny, does have some sore throat.  Not vomiting, no diarrhea, has had a little bit of nausea.  No known exposures to COVID or flu, but she does work with the public.  She has not tolerated prednisone very well in the past.  She really feels like albuterol by nebulizer treatment would be the most helpful thing.  HPI  Past Medical History:  Diagnosis Date   Acid reflux    Anemia    Asthma    BRCA negative 03/2017   MyRisk neg   Difficult intubation    Dyspnea    occasionally   Epilepsy ([place])    Family history of ovarian cancer 03/2017   MyRisk neg   Heart murmur    tricuspid valve "leaks"   Hypothyroidism    Pneumonia    Seizures ([place])    no seizure in several years    Patient Active Problem List   Diagnosis Date Noted   OSA (obstructive sleep apnea) 01/10/2020   Abnormal radionuclide bone scan 03/27/2018   Elevated alkaline phosphatase level 03/18/2018   Bone pain 03/09/2018   Menorrhagia with regular cycle 03/09/2018   Low vitamin B12 level 03/09/2018   Iron deficiency anemia 03/01/2018   Chest pain on exertion 01/26/2018   Malaise and fatigue 01/26/2018   Shortness of breath on exertion 01/26/2018   Snoring 01/26/2018   Acute respiratory distress 06/24/2017   Community acquired pneumonia 06/09/2017   BRCA negative 04/04/2017   Right foot pain 11/10/2016   Bursitis of left elbow  07/26/2016   Fibromyalgia muscle pain 01/26/2016   Muscle strain 01/26/2016   Vitamin D deficiency 01/26/2016   Heartburn    Acquired hypothyroidism 12/23/2015   Anxiety state 12/23/2015   Chronic fatigue, unspecified 12/15/2015   Mild obesity 12/15/2015   Multiple joint pain 12/15/2015   GERD (gastroesophageal reflux disease) 12/09/2015   Seizures ([place]) 12/09/2015    Past Surgical History:  Procedure Laterality Date   ABDOMINAL HYSTERECTOMY     CESAREAN SECTION     CYSTOSCOPY  04/17/2019   Procedure: CYSTOSCOPY;  Surgeon: Schermerhorn, Gwen Her, MD;  Location: [facility];  Service: Gynecology;;   ESOPHAGOGASTRODUODENOSCOPY (EGD) WITH PROPOFOL N/A 01/02/2016   Procedure: ESOPHAGOGASTRODUODENOSCOPY (EGD) WITH PROPOFOL;  Surgeon: Lucilla Lame, MD;  Location: [place];  Service: Endoscopy;  Laterality: N/A;   LAPAROSCOPIC LYSIS OF ADHESIONS  04/17/2019   Procedure: LAPAROSCOPIC LYSIS OF ADHESIONS;  Surgeon: Schermerhorn, Gwen Her, MD;  Location: [facility];  Service: Gynecology;;   LAPAROSCOPIC SUPRACERVICAL HYSTERECTOMY N/A 04/17/2019   Procedure: LAPAROSCOPIC SUPRACERVICAL HYSTERECTOMY;  Surgeon: Ouida Sills Gwen Her, MD;  Location: [facility];  Service: Gynecology;  Laterality: N/A;   LAPAROSCOPIC UNILATERAL SALPINGECTOMY Right 04/17/2019   Procedure: LAPAROSCOPIC UNILATERAL SALPINGECTOMY;  Surgeon: Schermerhorn, Gwen Her, MD;  Location: [facility];  Service: Gynecology;  Laterality: Right;   TONSILLECTOMY     TUBAL LIGATION      OB History     Gravida  3   Para  1   Term  1   Preterm      AB      Living  3      SAB      IAB      Ectopic      Multiple      Live Births  3            Home Medications    Prior to Admission medications   Medication Sig Start Date End Date Taking? Authorizing Provider  albuterol (PROVENTIL) (2.5 MG/3ML) 0.083% nebulizer solution Take 3 mLs (2.5 mg total) by nebulization every 6 (six) hours as needed for wheezing or  shortness of breath (use this form, instead of albuterol inhaler, as needed for wheezing, cough). 01/27/21  Yes Wynona Luna, MD  albuterol (VENTOLIN HFA) 108 (90 Base) MCG/ACT inhaler Inhale 2 puffs into the lungs every 6 (six) hours as needed for wheezing or shortness of breath. 11/12/19 09/18/21 Yes Lamptey, Myrene Galas, MD  azithromycin (ZITHROMAX Z-PAK) 250 MG tablet Two tabs by mouth on day one, then 1 tab daily until gone.  Start 02/01/2021 if productive cough not improving. 01/27/21  Yes Wynona Luna, MD  busPIRone (BUSPAR) 10 MG tablet Take 10 mg by mouth 2 (two) times daily.   Yes [provider]  Ipratropium-Albuterol (COMBIVENT) 20-100 MCG/ACT AERS respimat Inhale 1 puff into the lungs every 6 (six) hours as needed for wheezing or shortness of breath.  01/20/18  Yes [provider]  levothyroxine (SYNTHROID) 50 MCG tablet Take 50 mcg by mouth daily before breakfast.   Yes [provider]  omeprazole (PRILOSEC) 40 MG capsule Take 40 mg by mouth daily.    Yes [provider]  predniSONE (DELTASONE) 50 MG tablet Take 1 tablet (50 mg total) by mouth daily. 01/27/21  Yes Wynona Luna, MD  ALPRAZolam Duanne Moron) 0.5 MG tablet TAKE 1 TABLET BY MOUTH ONCE DAILY AS NEEDED FOR  ANXIETY 07/23/19   [provider]  benzonatate (TESSALON) 100 MG capsule Take 1 capsule (100 mg total) by mouth every 8 (eight) hours. Patient not taking: No sig reported 11/12/19   Chase Picket, MD  buPROPion (WELLBUTRIN XL) 150 MG 24 hr tablet Take 300 mg by mouth daily. 02/24/19   [provider]  clonazePAM (KLONOPIN) 0.5 MG tablet Take 0.5 mg by mouth daily as needed for anxiety. Patient not taking: Reported on 01/10/2020 06/07/18   [provider]  ergocalciferol (VITAMIN D2) 1.25 MG (50000 UT) capsule Take 50,000 Units by mouth once a week. Patient not taking: Reported on 01/10/2020    [provider]  escitalopram (LEXAPRO) 20 MG  tablet Take 20 mg by mouth daily.  12/04/15 08/22/20  [provider]  ferrous sulfate 325 (65 FE) MG tablet Take 325 mg by mouth daily with breakfast. Patient not taking: Reported on 01/10/2020    [provider]  Iron-Vitamin C 65-125 MG TABS 1 tablet daily x 1 week, then increase to 1 tablet twice a day. Patient not taking: Reported on 01/10/2020 03/01/18   Karen Kitchens, NP  meloxicam (MOBIC) 15 MG tablet Take 1 tablet (15 mg total) by mouth daily. 08/22/20   Zigmund Gottron, NP  vitamin B-12 (CYANOCOBALAMIN) 1000 MCG tablet Take 1,000 mcg by mouth daily. Patient not taking: Reported  on 01/10/2020    [provider]    Family History Family History  Problem Relation Age of Onset   Ovarian cancer Mother    Vaginal cancer Mother    Cancer Mother    Breast cancer Paternal Aunt    Diabetes Paternal Grandmother    Diabetes Paternal Grandfather    Breast cancer Paternal Aunt    Colon cancer Neg Hx    Heart disease Neg Hx     Social History Social History   Tobacco Use   Smoking status: Former    Types: Cigarettes    Quit date: 04/11/2010    Years since quitting: 10.8   Smokeless tobacco: Never  Vaping Use   Vaping Use: Never used  Substance Use Topics   Alcohol use: No   Drug use: No     Allergies   Dilantin [phenytoin sodium extended]   Review of Systems Review of Systems See HPI  Physical Exam Triage Vital Signs ED Triage Vitals  Enc Vitals Group     BP 01/27/21 1319 106/74     Pulse Rate 01/27/21 1319 77     Resp 01/27/21 1319 16     Temp 01/27/21 1319 97.8 F (36.6 C)     Temp Source 01/27/21 1319 Tympanic     SpO2 01/27/21 1319 98 %     Weight 01/27/21 1316 251 lb (113.9 kg)     Height 01/27/21 1316 5' 6"  (1.676 m)     Pain Score 01/27/21 1316 0     Pain Loc --      Updated Vital Signs BP 106/74 (BP Location: Left Arm)   Pulse 77   Temp 97.8 F (36.6 C) (Tympanic)   Resp 16   Ht 5' 6"  (1.676 m)   Wt 113.9 kg   LMP  03/22/2019   SpO2 98%   BMI 40.51 kg/m     Physical Exam Vitals and nursing note reviewed.  Constitutional:      General: She is not in acute distress.    Appearance: She is ill-appearing. She is not toxic-appearing.  HENT:     Head: Atraumatic.     Comments: Bilateral TMs are mildly dull, no erythema Moderate nasal congestion bilaterally with mucousy discharge Throat is quite red, with postnasal discharge evident    Mouth/Throat:     Mouth: Mucous membranes are moist.  Eyes:     Comments: Conjugate gaze observed, no eye redness/discharge  Cardiovascular:     Rate and Rhythm: Normal rate and regular rhythm.  Pulmonary:     Effort: No respiratory distress.     Comments: Coarse breath sounds throughout, wheezing cough which is easily triggered, nonfocal lung exam Abdominal:     General: There is no distension.  Musculoskeletal:        General: Normal range of motion.     Cervical back: Neck supple.  Skin:    General: Skin is warm and dry.     Comments: 1+ lower extremity edema bilaterally  Neurological:     Mental Status: She is alert.     Comments: Speech is clear and coherent Patient is able to walk into the urgent care independently, and climb on/off the exam table without assistance     UC Treatments / Results  Labs no labs indicated at urgent care today    Radiology No results found.  No imaging indicated at urgent care today    Medications Ordered in UC Medications - No data to display no meds  given at urgent care today      Final Clinical Impressions(s) / UC Diagnoses   Final diagnoses:  Acute exacerbation of chronic bronchitis ([place])     Discharge Instructions      Symptoms today seem most consistent with acute episode of chronic bronchitis, probably triggered by a viral infection.  Prescriptions for albuterol nebs and prednisone (for irritated airways) sent to pharmacy.  Prescription for zithromax, to start 11/13 if productive cough not  improving, also sent to pharmacy.  Note for work tomorrow.  Anticipate gradual improvement over the next several days as the virus runs its course.  Recheck or followup with your primary care provider for new fever >100.5, increasing phlegm production/nasal discharge, or if not starting to improve in the expected time course.   ED Prescriptions     Medication Sig Dispense Auth. Provider   albuterol (PROVENTIL) (2.5 MG/3ML) 0.083% nebulizer solution Take 3 mLs (2.5 mg total) by nebulization every 6 (six) hours as needed for wheezing or shortness of breath (use this form, instead of albuterol inhaler, as needed for wheezing, cough). 75 mL Wynona Luna, MD   azithromycin (ZITHROMAX Z-PAK) 250 MG tablet Two tabs by mouth on day one, then 1 tab daily until gone.  Start 02/01/2021 if productive cough not improving. 6 tablet Wynona Luna, MD   predniSONE (DELTASONE) 50 MG tablet Take 1 tablet (50 mg total) by mouth daily. 3 tablet Wynona Luna, MD      PDMP not reviewed this encounter.   Wynona Luna, MD 01/27/21 1455

## 2021-03-11 ENCOUNTER — Other Ambulatory Visit: Payer: Self-pay

## 2021-03-11 ENCOUNTER — Other Ambulatory Visit: Payer: Self-pay | Admitting: Family Medicine

## 2021-03-11 ENCOUNTER — Ambulatory Visit
Admission: RE | Admit: 2021-03-11 | Discharge: 2021-03-11 | Disposition: A | Discharge status: Home / Self Care | Payer: Medicaid Other | Source: Ambulatory Visit | Attending: Family Medicine | Admitting: Family Medicine

## 2021-03-11 ENCOUNTER — Ambulatory Visit
Admission: RE | Admit: 2021-03-11 | Discharge: 2021-03-11 | Disposition: A | Discharge status: Home / Self Care | Payer: Medicaid Other | Attending: Family Medicine | Admitting: Family Medicine

## 2021-03-11 DIAGNOSIS — U071 COVID-19: Secondary | ICD-10-CM

## 2021-03-11 DIAGNOSIS — R0789 Other chest pain: Secondary | ICD-10-CM | POA: Diagnosis present

## 2021-03-11 IMAGING — US US THYROID
1 series · 14 of 25 positions shown · non-contrast
Comparison: None.

CLINICAL DATA: Neck pain.

EXAM:
THYROID ULTRASOUND
TECHNIQUE: Ultrasound examination of the thyroid gland and adjacent soft
tissues was performed.

[Series 1: us thyroid · 60 acquisitions, 14 frames shown]
[im 1/60]
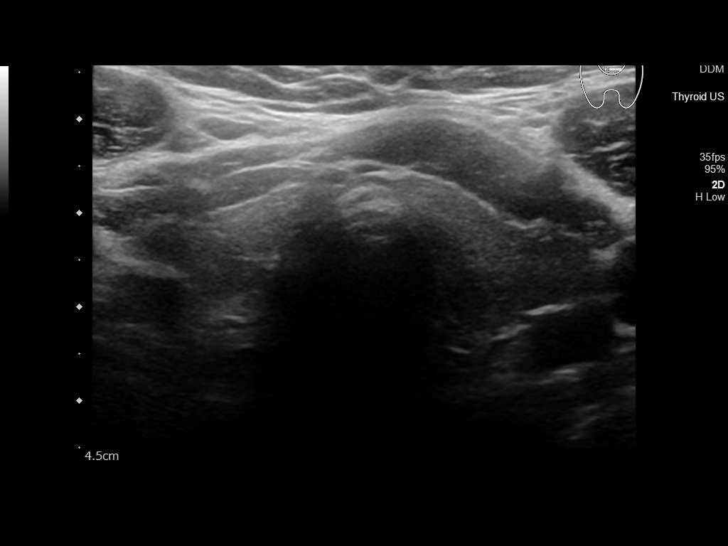
[im 5/60]
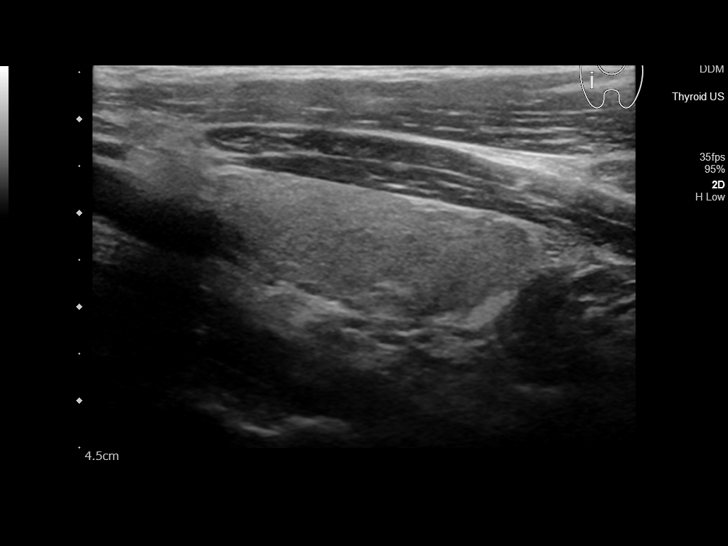
[im 10/60]
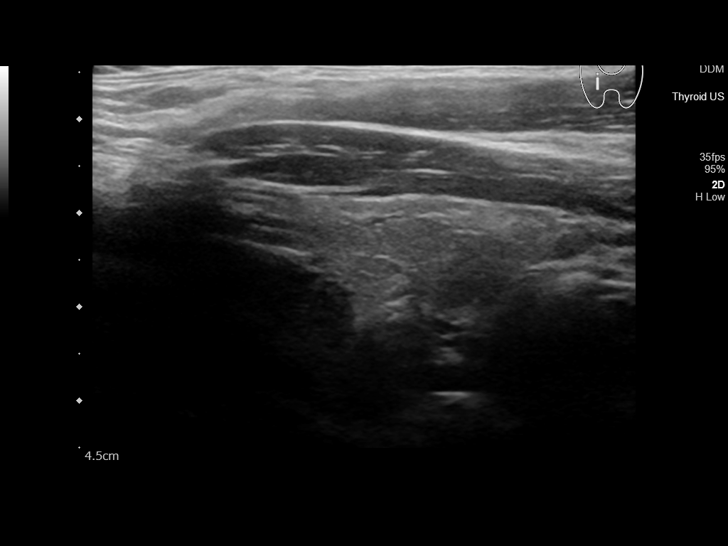
[im 15/60]
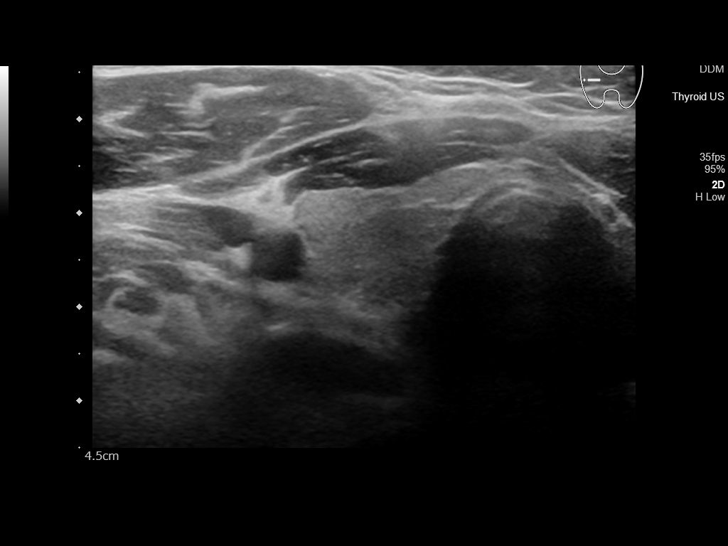
[im 20/60]
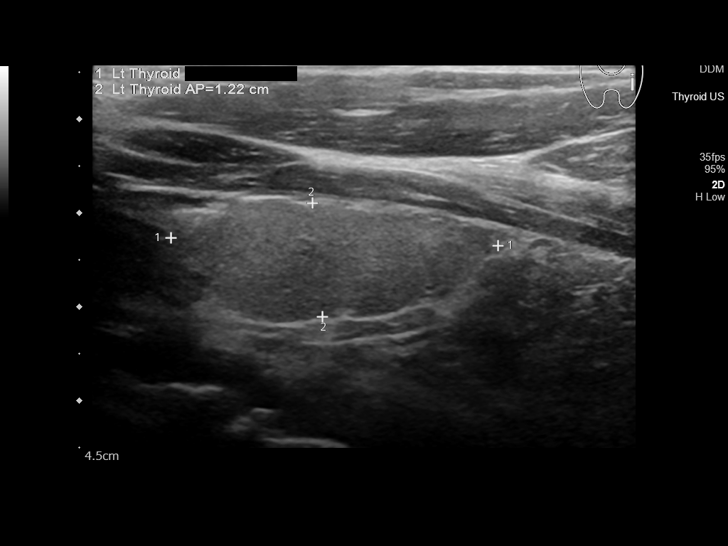
[im 23/60]
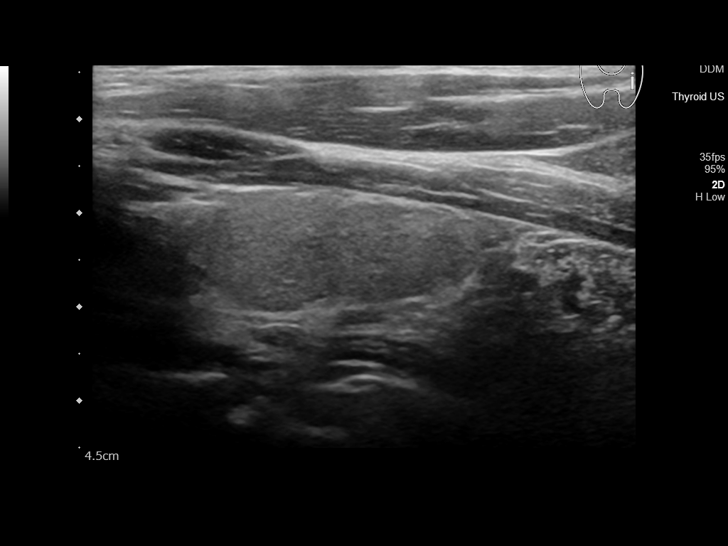
[im 28/60]
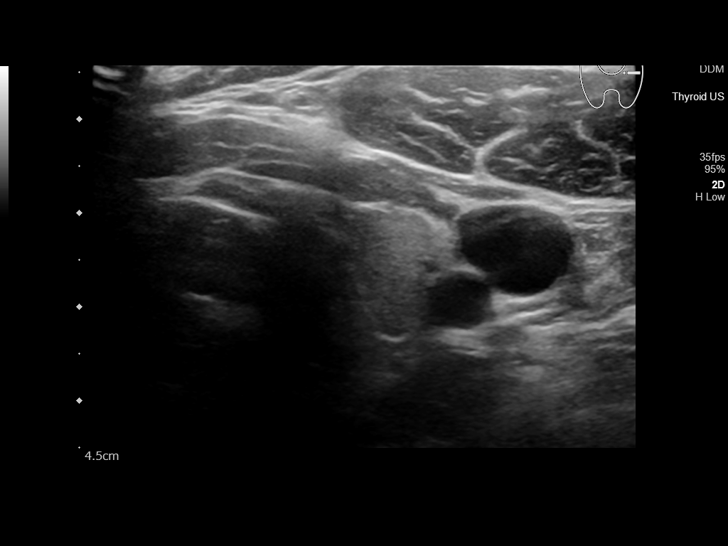
[im 32/60]
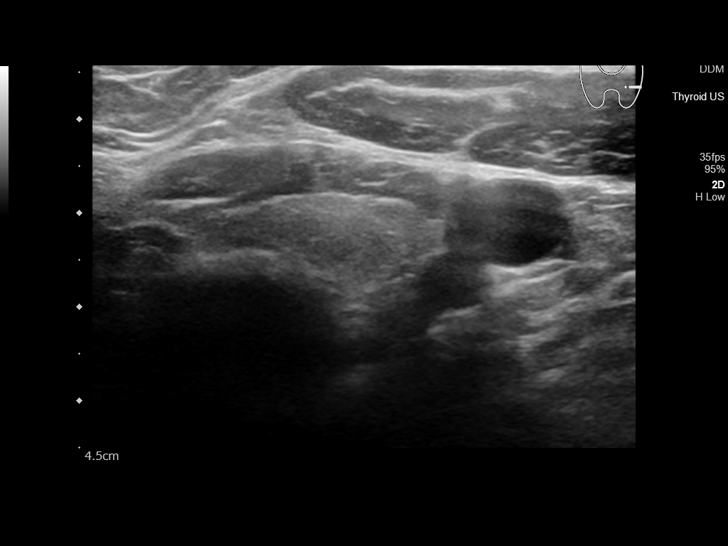
[im 37/60]
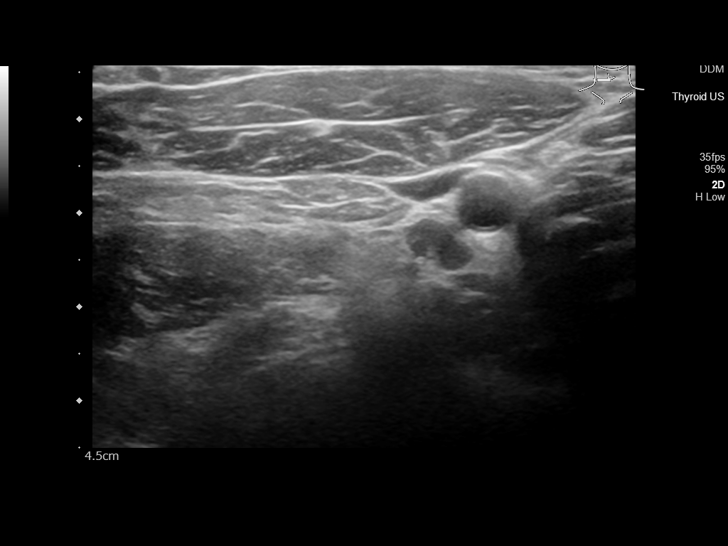
[im 40/60]
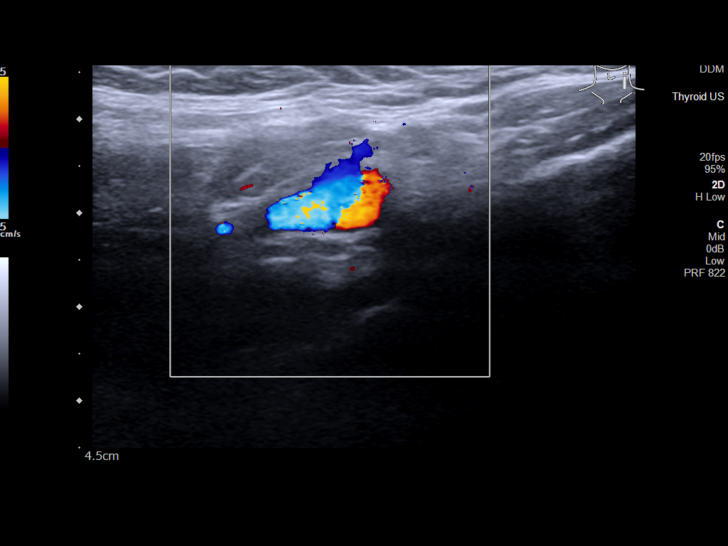
[im 45/60]
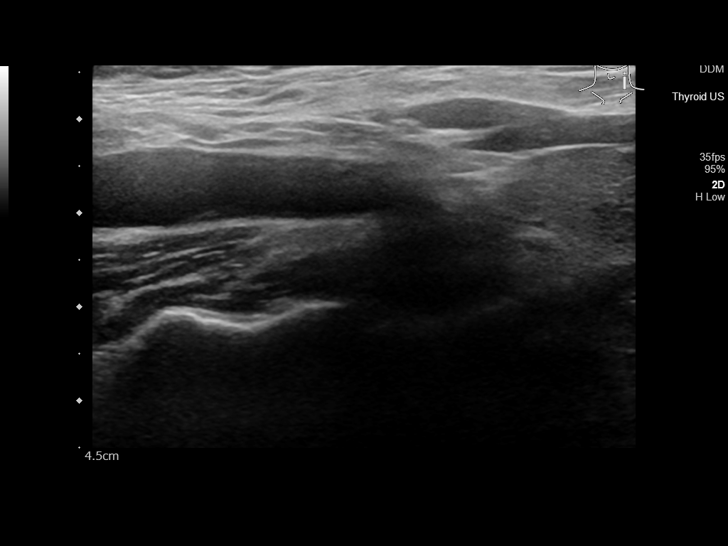
[im 50/60]
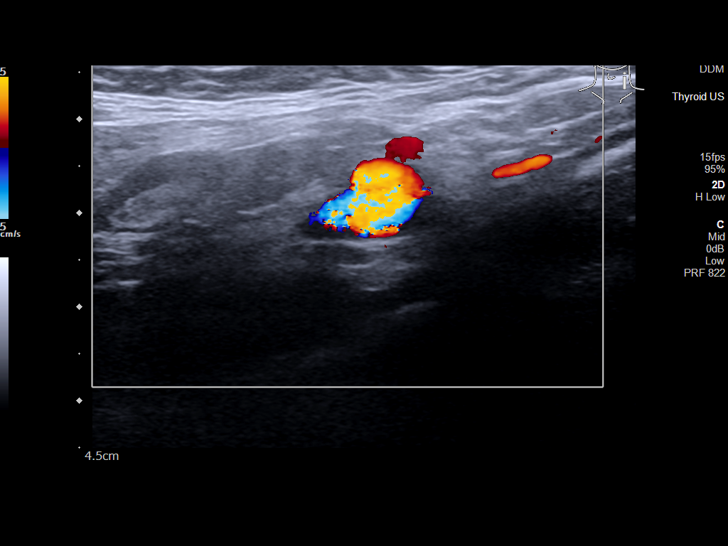
[im 55/60]
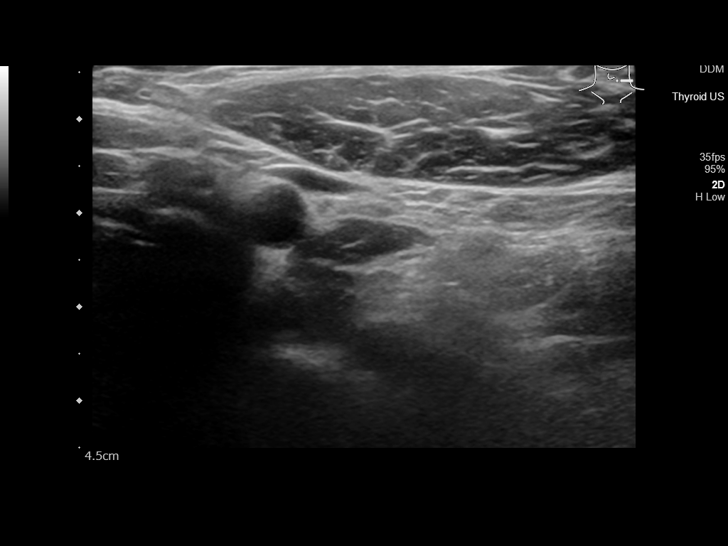
[im 60/60]
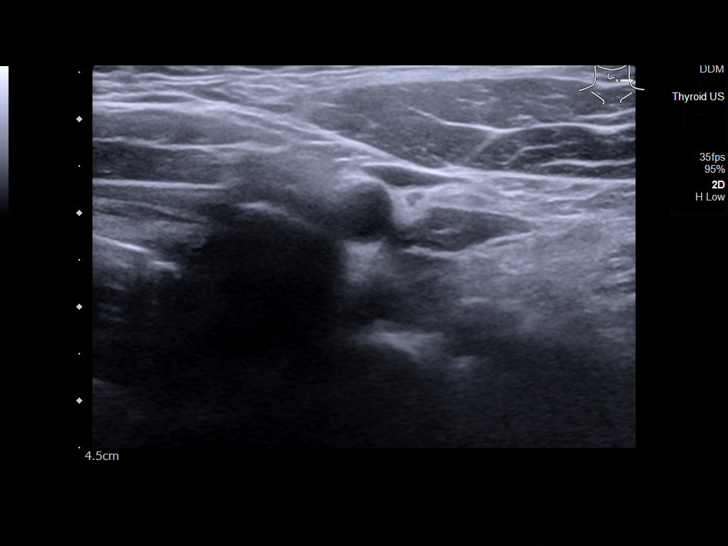

[14 of 25 positions shown; findings below may reference images not displayed]

FINDINGS: Parenchymal Echotexture: Normal

Isthmus: 0.4 cm

Right lobe: 3.7 x 1.3 x 1.6 cm

Left lobe: 3.5 x 1.2 x 1.6 cm

_________________________________________________________

Estimated total number of nodules >/= 1 cm: 0

Number of spongiform nodules >/=  2 cm not described below (TR1): 0

Number of mixed cystic and solid nodules >/= 1.5 cm not described
below (TR2): 0

_________________________________________________________

No discrete nodules are seen within the thyroid gland.
IMPRESSION: Normal study.

The above is in keeping with the ACR TI-RADS recommendations - [HOSPITAL] 2432;[DATE].

## 2021-05-08 ENCOUNTER — Other Ambulatory Visit: Payer: Self-pay | Admitting: Cardiology

## 2021-05-08 DIAGNOSIS — M79604 Pain in right leg: Secondary | ICD-10-CM

## 2021-05-18 ENCOUNTER — Other Ambulatory Visit: Payer: Self-pay

## 2021-05-18 ENCOUNTER — Ambulatory Visit
Admission: RE | Admit: 2021-05-18 | Discharge: 2021-05-18 | Disposition: A | Discharge status: Home / Self Care | Payer: Medicaid Other | Source: Ambulatory Visit | Attending: Cardiology | Admitting: Cardiology

## 2021-05-18 DIAGNOSIS — M79604 Pain in right leg: Secondary | ICD-10-CM | POA: Insufficient documentation

## 2021-07-21 ENCOUNTER — Ambulatory Visit
Admission: RE | Admit: 2021-07-21 | Discharge: 2021-07-21 | Disposition: A | Discharge status: Home / Self Care | Payer: Medicaid Other | Source: Ambulatory Visit | Attending: Family Medicine | Admitting: Family Medicine

## 2021-07-21 ENCOUNTER — Other Ambulatory Visit: Payer: Self-pay | Admitting: Family Medicine

## 2021-07-21 ENCOUNTER — Ambulatory Visit
Admission: RE | Admit: 2021-07-21 | Discharge: 2021-07-21 | Disposition: A | Discharge status: Home / Self Care | Payer: Medicaid Other | Attending: Family Medicine | Admitting: Family Medicine

## 2021-07-21 DIAGNOSIS — R634 Abnormal weight loss: Secondary | ICD-10-CM | POA: Insufficient documentation

## 2021-07-21 DIAGNOSIS — Z1231 Encounter for screening mammogram for malignant neoplasm of breast: Secondary | ICD-10-CM

## 2021-07-30 ENCOUNTER — Ambulatory Visit
Admission: RE | Admit: 2021-07-30 | Discharge: 2021-07-30 | Disposition: A | Discharge status: Home / Self Care | Payer: Medicaid Other | Source: Ambulatory Visit | Attending: Family Medicine | Admitting: Family Medicine

## 2021-07-30 DIAGNOSIS — Z1231 Encounter for screening mammogram for malignant neoplasm of breast: Secondary | ICD-10-CM | POA: Insufficient documentation

## 2023-01-13 ENCOUNTER — Other Ambulatory Visit: Payer: Self-pay

## 2023-01-13 ENCOUNTER — Emergency Department
Admission: EM | Admit: 2023-01-13 | Discharge: 2023-01-13 | Disposition: A | Discharge status: Home / Self Care | Payer: Self-pay | Attending: Emergency Medicine | Admitting: Emergency Medicine

## 2023-01-13 ENCOUNTER — Encounter: Payer: Self-pay | Admitting: Hematology and Oncology

## 2023-01-13 ENCOUNTER — Emergency Department: Payer: Self-pay

## 2023-01-13 DIAGNOSIS — R569 Unspecified convulsions: Secondary | ICD-10-CM | POA: Insufficient documentation

## 2023-01-13 DIAGNOSIS — R519 Headache, unspecified: Secondary | ICD-10-CM | POA: Insufficient documentation

## 2023-01-13 LAB — COMPREHENSIVE METABOLIC PANEL
ALT: 25 U/L (ref 0–44)
AST: 21 U/L (ref 15–41)
Albumin: 4.1 g/dL (ref 3.5–5.0)
Alkaline Phosphatase: 79 U/L (ref 38–126)
Anion gap: 9 (ref 5–15)
BUN: 9 mg/dL (ref 6–20)
CO2: 23 mmol/L (ref 22–32)
Calcium: 8.9 mg/dL (ref 8.9–10.3)
Chloride: 103 mmol/L (ref 98–111)
Creatinine, Ser: 0.92 mg/dL (ref 0.44–1.00)
GFR, Estimated: >60 mL/min (ref >60)
Glucose, Bld: 103 mg/dL — ABNORMAL HIGH (ref 70–99)
Potassium: 3.4 mmol/L — ABNORMAL LOW (ref 3.5–5.1)
Sodium: 135 mmol/L (ref 135–145)
Total Bilirubin: 0.9 mg/dL (ref 0.3–1.2)
Total Protein: 7.2 g/dL (ref 6.5–8.1)

## 2023-01-13 LAB — URINALYSIS, ROUTINE W REFLEX MICROSCOPIC
Bacteria, UA: NONE SEEN
Bilirubin Urine: NEGATIVE
Glucose, UA: NEGATIVE mg/dL
Hgb urine dipstick: NEGATIVE
Ketones, ur: NEGATIVE mg/dL
Leukocytes,Ua: NEGATIVE
Nitrite: NEGATIVE
Protein, ur: 30 mg/dL — AB
Specific Gravity, Urine: 1.019 (ref 1.005–1.030)
pH: 5 (ref 5.0–8.0)

## 2023-01-13 LAB — CBC
HCT: 41.3 % (ref 36.0–46.0)
Hemoglobin: 14.1 g/dL (ref 12.0–15.0)
MCH: 28.6 pg (ref 26.0–34.0)
MCHC: 34.1 g/dL (ref 30.0–36.0)
MCV: 83.8 fL (ref 80.0–100.0)
Platelets: 305 10*3/uL (ref 150–400)
RBC: 4.93 MIL/uL (ref 3.87–5.11)
RDW: 12.5 % (ref 11.5–15.5)
WBC: 11 10*3/uL — ABNORMAL HIGH (ref 4.0–10.5)
nRBC: 0 % (ref 0.0–0.2)

## 2023-01-13 MED ORDER — ONDANSETRON HCL 4 MG/2ML IJ SOLN
4.0000 mg | Freq: Once | INTRAMUSCULAR | Status: AC | PRN
  Administered 2023-01-13: 4 mg via INTRAVENOUS
  Filled 2023-01-13: qty 2

## 2023-01-13 MED ORDER — KETOROLAC TROMETHAMINE 15 MG/ML IJ SOLN
15.0000 mg | Freq: Once | INTRAMUSCULAR | Status: AC
Start: 2023-01-13 — End: 2023-01-13
  Administered 2023-01-13: 15 mg via INTRAVENOUS
  Filled 2023-01-13: qty 1

## 2023-01-13 MED ORDER — LEVETIRACETAM 500 MG PO TABS
1000.0000 mg | ORAL_TABLET | Freq: Once | ORAL | Status: AC
  Administered 2023-01-13: 1000 mg via ORAL
  Filled 2023-01-13: qty 2

## 2023-01-13 MED ORDER — FENTANYL CITRATE PF 50 MCG/ML IJ SOSY
50.0000 ug | PREFILLED_SYRINGE | Freq: Once | INTRAMUSCULAR | Status: AC
  Administered 2023-01-13: 50 ug via INTRAVENOUS
  Filled 2023-01-13: qty 1

## 2023-01-13 MED ORDER — LEVETIRACETAM 500 MG PO TABS: 500.0000 mg | ORAL_TABLET | Freq: Two times a day (BID) | ORAL | 2 refills | Status: AC

## 2023-01-13 MED ORDER — ACETAMINOPHEN 500 MG PO TABS
1000.0000 mg | ORAL_TABLET | Freq: Once | ORAL | Status: AC
  Administered 2023-01-13: 1000 mg via ORAL
  Filled 2023-01-13: qty 2

## 2023-01-13 NOTE — ED Provider Notes (Signed)
[facility] Provider Note    Event Date/Time   First MD Initiated Contact with Patient 01/13/23 2017     (approximate)   History   Seizures   HPI  Colleen Grimes is a 42 year old female with history of epilepsy not currently taking medications presenting to the emergency department for evaluation following a seizure-like episode.  Last seizure-like episode was several years ago, reports she was taken off of her medication.  Did feel aura similar to prior seizures earlier today and was able to tell her daughter.  She had a witnessed seizure-like episode where she fell backwards, hit her head on a door frame with generalized tonic-clonic activity.  Does currently report a headache which she reports is not unusual following her seizures.  Has been under increased stress with some decreased p.o. recently.  Denies other complaints.       Physical Exam   Triage Vital Signs: ED Triage Vitals  Encounter Vitals Group     BP 01/13/23 1935 113/79     Systolic BP Percentile --      Diastolic BP Percentile --      Pulse Rate 01/13/23 1935 65     Resp 01/13/23 1935 20     Temp 01/13/23 1935 98 F (36.7 C)     Temp Source 01/13/23 1935 Oral     SpO2 01/13/23 1935 95 %     Weight 01/13/23 1928 180 lb (81.6 kg)     Height 01/13/23 1928 5\' 6"  (1.676 m)     Head Circumference --      Peak Flow --      Pain Score 01/13/23 1927 10     Pain Loc --      Pain Education --      Exclude from Growth Chart --     Most recent vital signs: Vitals:   01/13/23 2215 01/13/23 2230  BP:    Pulse: 63 67  Resp: 12 11  Temp:    SpO2: 98% 97%     General: Awake, interactive  Head:  Small bump of her posterior head, no open skin or active bleeding CV:  Regular rate, good peripheral perfusion.  Resp:  Lungs clear to consultation, unlabored respirations Abd:  Nondistended.  Neuro:  Alert and oriented, normal extraocular movements, symmetric facial movement, sensation intact  over bilateral upper and lower extremities with 5 out of 5 strength.  Normal finger-to-nose testing.   ED Results / Procedures / Treatments   Labs (all labs ordered are listed, but only abnormal results are displayed) Labs Reviewed  CBC - Abnormal; Notable for the following components:      Result Value   WBC 11.0 (*)    All other components within normal limits  COMPREHENSIVE METABOLIC PANEL - Abnormal; Notable for the following components:   Potassium 3.4 (*)    Glucose, Bld 103 (*)    All other components within normal limits  URINALYSIS, ROUTINE W REFLEX MICROSCOPIC - Abnormal; Notable for the following components:   Color, Urine YELLOW (*)    APPearance TURBID (*)    Protein, ur 30 (*)    All other components within normal limits     EKG EKG independently reviewed interpreted by myself (ER attending) demonstrates:  EKG demonstrate sinus rhythm at a rate of 60, PR 136, QRS 93, QTc 421, no acute ST changes  RADIOLOGY Imaging independently reviewed and interpreted by myself demonstrates:  CT head without acute bleed  PROCEDURES:  Critical  Care performed: No  Procedures   MEDICATIONS ORDERED IN ED: Medications  levETIRAcetam (KEPPRA) tablet 1,000 mg (has no administration in time range)  ondansetron (ZOFRAN) injection 4 mg (4 mg Intravenous Given 01/13/23 1937)  acetaminophen (TYLENOL) tablet 1,000 mg (1,000 mg Oral Given 01/13/23 1936)  fentaNYL (SUBLIMAZE) injection 50 mcg (50 mcg Intravenous Given 01/13/23 2109)  ketorolac (TORADOL) 15 MG/ML injection 15 mg (15 mg Intravenous Given 01/13/23 2224)     IMPRESSION / MDM / ASSESSMENT AND PLAN / ED COURSE  I reviewed the triage vital signs and the nursing notes.  Differential diagnosis includes, but is not limited to, breakthrough seizure secondary to medication nonadherence, stress, electrolyte abnormality, intracranial bleed, convulsive syncope  Patient's presentation is most consistent with acute presentation  with potential threat to life or bodily function.  42 year old female presenting to the emergency department for evaluation after a witnessed seizure-like episode.  Known history of seizures, previously on AEDs, but no longer taking.  Return to baseline here.  With head trauma and for seizure-like episode in years, head CT was obtained which was reassuring.  Labs without critical derangement.  Patient reevaluated.  She is comfortable with discharge home.  I do see that several years ago she was placed on Keppra twice a day by neurology, but it does not appear she continued this.  Do think would be reasonable to restart her on this and have her arrange follow-up.  Patient comfortable with this plan.  Strict return precautions provided.  Patient discharged in stable condition.      FINAL CLINICAL IMPRESSION(S) / ED DIAGNOSES   Final diagnoses:  Seizure-like activity (HCC)     Rx / DC Orders   ED Discharge Orders          Ordered    levETIRAcetam (KEPPRA) 500 MG tablet  2 times daily        01/13/23 2253             Note:  This document was prepared using Dragon voice recognition software and may include unintentional dictation errors.   Trinna Post, MD 01/13/23 [phone]

## 2023-01-13 NOTE — Discharge Instructions (Signed)
You were seen in the emergency department today after a seizure-like episode.  Your testing was overall reassuring.  I do recommend restarting the seizure preventative medicine called Keppra that you were previously taking.  I have sent a prescription for this.  Please also arrange follow-up with neurology as soon as possible.  Return to the ER for new or worsening symptoms.

## 2023-01-13 NOTE — ED Triage Notes (Signed)
Pt reports witnessed seizure at home. Pt has hx of epilepsy. States she began to experience her aura and told her daughter. Pt states her last seizure was last September. Also states she is not currently on any seizure medication. Per pt daughter, seizure lasted ~2 minute. Pt fell against a door at that time. Pt has been confused and reports memory lapses of earlier parts of the day even before the seizure. Pt is c/o h/a and emesis. Also reports pain to R hand where she hit in on the doorway. Pt is currently alert and oriented following commands. Pt is breathing unlabored speaking in full sentences. Symmetric chest rise and fall noted.

## 2023-01-13 NOTE — ED Notes (Signed)
Pt c/o of headache pain in the back middle of her head.

## 2023-09-15 ENCOUNTER — Other Ambulatory Visit: Payer: Self-pay | Admitting: Cardiology

## 2023-09-15 ENCOUNTER — Encounter: Payer: Self-pay | Admitting: Hematology and Oncology

## 2023-09-15 DIAGNOSIS — Z8249 Family history of ischemic heart disease and other diseases of the circulatory system: Secondary | ICD-10-CM

## 2023-09-15 DIAGNOSIS — Z87891 Personal history of nicotine dependence: Secondary | ICD-10-CM

## 2023-09-15 DIAGNOSIS — R0609 Other forms of dyspnea: Secondary | ICD-10-CM

## 2023-09-15 DIAGNOSIS — R0789 Other chest pain: Secondary | ICD-10-CM

## 2023-09-22 ENCOUNTER — Telehealth (HOSPITAL_COMMUNITY): Payer: Self-pay | Admitting: *Deleted

## 2023-09-22 MED ORDER — METOPROLOL TARTRATE 100 MG PO TABS: ORAL_TABLET | ORAL | 0 refills | Status: AC

## 2023-09-22 NOTE — Telephone Encounter (Signed)
 Attempted to call patient regarding upcoming cardiac CT appointment. Left message on voicemail with name and callback number  Larey Brick RN Navigator Cardiac Imaging [facility] [phone] Office [phone] Cell

## 2023-09-22 NOTE — Telephone Encounter (Signed)
Patient returning call about her upcoming cardiac imaging study; pt verbalizes understanding of appt date/time, parking situation and where to check in, pre-test NPO status and medications ordered, and verified current allergies; name and call back number provided for further questions should they arise  Daylyn Azbill RN Navigator Cardiac Imaging [facility] [phone] office [phone] cell  Patient to take 100mg metoprolol tartrate two hours prior to her cardiac CT scan. 

## 2023-09-26 ENCOUNTER — Ambulatory Visit
Admission: RE | Admit: 2023-09-26 | Discharge: 2023-09-26 | Disposition: A | Discharge status: Home / Self Care | Source: Ambulatory Visit | Attending: Cardiology | Admitting: Cardiology

## 2023-09-26 DIAGNOSIS — Z8249 Family history of ischemic heart disease and other diseases of the circulatory system: Secondary | ICD-10-CM | POA: Insufficient documentation

## 2023-09-26 DIAGNOSIS — Z87891 Personal history of nicotine dependence: Secondary | ICD-10-CM | POA: Insufficient documentation

## 2023-09-26 DIAGNOSIS — R0609 Other forms of dyspnea: Secondary | ICD-10-CM | POA: Diagnosis present

## 2023-09-26 DIAGNOSIS — R0789 Other chest pain: Secondary | ICD-10-CM | POA: Diagnosis present

## 2023-09-26 MED ORDER — NITROGLYCERIN 0.4 MG SL SUBL
SUBLINGUAL_TABLET | SUBLINGUAL | Status: AC
  Filled 2023-09-26: qty 2

## 2023-09-26 MED ORDER — IOHEXOL 350 MG/ML SOLN
100.0000 mL | Freq: Once | INTRAVENOUS | Status: AC | PRN
  Administered 2023-09-26: 100 mL via INTRAVENOUS

## 2023-09-26 MED ORDER — NITROGLYCERIN 0.4 MG SL SUBL
0.8000 mg | SUBLINGUAL_TABLET | Freq: Once | SUBLINGUAL | Status: AC
  Administered 2023-09-26: 0.8 mg via SUBLINGUAL
  Filled 2023-09-26: qty 25

## 2023-09-26 MED ORDER — DILTIAZEM HCL 25 MG/5ML IV SOLN
10.0000 mg | INTRAVENOUS | Status: DC | PRN
  Filled 2023-09-26: qty 5

## 2023-09-26 MED ORDER — METOPROLOL TARTRATE 5 MG/5ML IV SOLN
10.0000 mg | Freq: Once | INTRAVENOUS | Status: DC | PRN
  Filled 2023-09-26: qty 10

## 2023-12-26 ENCOUNTER — Emergency Department: Admission: EM | Admit: 2023-12-26 | Discharge: 2023-12-27 | Disposition: A

## 2023-12-26 ENCOUNTER — Encounter: Payer: Self-pay | Admitting: Emergency Medicine

## 2023-12-26 DIAGNOSIS — J45909 Unspecified asthma, uncomplicated: Secondary | ICD-10-CM | POA: Diagnosis not present

## 2023-12-26 DIAGNOSIS — R569 Unspecified convulsions: Secondary | ICD-10-CM | POA: Diagnosis present

## 2023-12-26 DIAGNOSIS — E871 Hypo-osmolality and hyponatremia: Secondary | ICD-10-CM | POA: Diagnosis not present

## 2023-12-26 LAB — CBC WITH DIFFERENTIAL/PLATELET
Abs Immature Granulocytes: 0.03 K/uL (ref 0.00–0.07)
Basophils Absolute: 0.1 K/uL (ref 0.0–0.1)
Basophils Relative: 1 %
Eosinophils Absolute: 0.1 K/uL (ref 0.0–0.5)
Eosinophils Relative: 1 %
HCT: 38.4 % (ref 36.0–46.0)
Hemoglobin: 13.2 g/dL (ref 12.0–15.0)
Immature Granulocytes: 0 %
Lymphocytes Relative: 23 %
Lymphs Abs: 1.7 K/uL (ref 0.7–4.0)
MCH: 29 pg (ref 26.0–34.0)
MCHC: 34.4 g/dL (ref 30.0–36.0)
MCV: 84.4 fL (ref 80.0–100.0)
Monocytes Absolute: 0.4 K/uL (ref 0.1–1.0)
Monocytes Relative: 5 %
Neutro Abs: 5.2 K/uL (ref 1.7–7.7)
Neutrophils Relative %: 70 %
Platelets: 306 K/uL (ref 150–400)
RBC: 4.55 MIL/uL (ref 3.87–5.11)
RDW: 13.1 % (ref 11.5–15.5)
WBC: 7.6 K/uL (ref 4.0–10.5)
nRBC: 0 % (ref 0.0–0.2)

## 2023-12-26 LAB — COMPREHENSIVE METABOLIC PANEL WITH GFR
ALT: 33 U/L (ref 0–44)
AST: 38 U/L (ref 15–41)
Albumin: 3.8 g/dL (ref 3.5–5.0)
Alkaline Phosphatase: 79 U/L (ref 38–126)
Anion gap: 10 (ref 5–15)
BUN: 12 mg/dL (ref 6–20)
CO2: 25 mmol/L (ref 22–32)
Calcium: 8.8 mg/dL — ABNORMAL LOW (ref 8.9–10.3)
Chloride: 99 mmol/L (ref 98–111)
Creatinine, Ser: 0.95 mg/dL (ref 0.44–1.00)
GFR, Estimated: >60 mL/min (ref >60)
Glucose, Bld: 108 mg/dL — ABNORMAL HIGH (ref 70–99)
Potassium: 3.6 mmol/L (ref 3.5–5.1)
Sodium: 134 mmol/L — ABNORMAL LOW (ref 135–145)
Total Bilirubin: 1 mg/dL (ref 0.0–1.2)
Total Protein: 7.1 g/dL (ref 6.5–8.1)

## 2023-12-26 LAB — TROPONIN I (HIGH SENSITIVITY)
Troponin I (High Sensitivity): 8 ng/L (ref <18)
Troponin I (High Sensitivity): 48 ng/L — ABNORMAL HIGH (ref <18)

## 2023-12-26 LAB — HCG, QUANTITATIVE, PREGNANCY: hCG, Beta Chain, Quant, S: <1 m[IU]/mL (ref <5)

## 2023-12-26 MED ORDER — LEVETIRACETAM (KEPPRA) 500 MG/5 ML ADULT IV PUSH
1000.0000 mg | Freq: Once | INTRAVENOUS | Status: AC
  Administered 2023-12-26: 1000 mg via INTRAVENOUS
  Filled 2023-12-26: qty 10

## 2023-12-26 MED ORDER — ONDANSETRON HCL 4 MG/2ML IJ SOLN
4.0000 mg | Freq: Once | INTRAMUSCULAR | Status: AC
  Administered 2023-12-26: 4 mg via INTRAVENOUS
  Filled 2023-12-26: qty 2

## 2023-12-26 MED ORDER — ACETAMINOPHEN 325 MG PO TABS
650.0000 mg | ORAL_TABLET | Freq: Once | ORAL | Status: AC
  Administered 2023-12-26: 650 mg via ORAL
  Filled 2023-12-26: qty 2

## 2023-12-26 NOTE — Discharge Instructions (Addendum)
 Your evaluation in the emergency department was overall reassuring.  It is very importantly take your seizure medication as prescribed.  Please do follow-up with your neurologist and primary care provider for reevaluation, and return to the emergency department with any new or worsening symptoms.

## 2023-12-26 NOTE — ED Provider Notes (Signed)
 [facility] Provider Note    Event Date/Time   First MD Initiated Contact with Patient 12/26/23 1929     (approximate)   History   Seizures  [place]. [place] since 1730; has not been taking seizure meds for a few days.  Take keppra  at home and has it at home; unsure why not taking it. 4 times vomiting. Zofran  given by EMS.  Family witnessed- full body convulsions for 2 mins. No head trauma or mouth trauma nor incontinence. Was very diaphoretic and was complaining of chest pain right before. Now chest pain free. LR 200 cc given by EMS. Was not hypotensive on scene. Initial BP 132/88. Pt was not post ictal for fire who got on scene very quickly.    HPI Colleen Grimes is a 43 y.o. female PMH epilepsy, asthma presents for evaluation after witnessed seizure - Per EMS, patient was at a baseball field, family witnessed full body convulsions for about 2 minutes.  No head trauma, no urinary incontinence.  Was reportedly diaphoretic before episode.  Normotensive.  Not postictal for EMS.  Glucose normal. - Per patient, states she felt the usual sensation that she normally feels before a seizure, subsequently had a witnessed 2-minute seizure, described as tonic-clonic by her daughter who is bedside.  They caught her when she started seizing, no associated trauma.  States she did have mild chest discomfort earlier in the day though this is not abnormal for her and recently had negative cardiac workup, was not immediately preceding episode. - Has not been taking her Keppra  for the past few days because she thought maybe she did not need it anymore. - Did vomit a few times after versus seizure episode which is not abnormal for her.  Had a moderate headache which has notably improved, says this is also typical after her seizures.  Not on any blood thinners. - Has otherwise been in her usual state of health.  No recent infectious symptoms. - Endorses THC use  earlier today, denies any other substances including alcohol  Per chart review, last CT head in October 2024, unremarkable.  Appears patient also had an outpatient cardiac CT on 09/26/2023, no evidence of CAD.     Physical Exam   Triage Vital Signs: ED Triage Vitals [12/26/23 1930]  Encounter Vitals Group     BP 116/73     Girls Systolic BP Percentile      Girls Diastolic BP Percentile      Boys Systolic BP Percentile      Boys Diastolic BP Percentile      Pulse Rate 84     Resp 19     Temp 98.2 F (36.8 C)     Temp Source Oral     SpO2 98 %     Weight      Height      Head Circumference      Peak Flow      Pain Score      Pain Loc      Pain Education      Exclude from Growth Chart     Most recent vital signs: Vitals:   12/26/23 2134 12/26/23 2300  BP:  102/65  Pulse:    Resp:    Temp: (!) 97.5 F (36.4 C)   SpO2:       General: Awake, no distress.  HEENT: Normocephalic, atraumatic, PERRL, EOMI CV:  Good peripheral perfusion. RRR, RP 2+ Resp:  Normal effort.  CTAB Abd:  No distention. Nontender to deep palpation throughout Neuro:  Aox4, CN II-XII intact, FNF wnl, finger taps fast b/l, 5/5 strength in bilateral finger extension/grip, arm flexion/extension, EHL/FHL. BUE AG 10+ sec no drift, BLE AG 5+ sec no drift. Ambulates with steady gait. SILT. Negative Rhomberg.    ED Results / Procedures / Treatments   Labs (all labs ordered are listed, but only abnormal results are displayed) Labs Reviewed  COMPREHENSIVE METABOLIC PANEL WITH GFR - Abnormal; Notable for the following components:      Result Value   Sodium 134 (*)    Glucose, Bld 108 (*)    Calcium 8.8 (*)    All other components within normal limits  TROPONIN I (HIGH SENSITIVITY) - Abnormal; Notable for the following components:   Troponin I (High Sensitivity) 48 (*)    All other components within normal limits  CBC WITH DIFFERENTIAL/PLATELET  HCG, QUANTITATIVE, PREGNANCY  LIPASE, BLOOD   TROPONIN I (HIGH SENSITIVITY)  TROPONIN I (HIGH SENSITIVITY)     EKG  Ecg = sinus rhythm, rate 74, no gross ST elevation or depression, no significant repolarization abnormality, normal axis, normal intervals.  No evidence of ischemia nor arrhythmia my interpretation.   RADIOLOGY N/a    PROCEDURES:  Critical Care performed: No  Procedures   MEDICATIONS ORDERED IN ED: Medications  ondansetron  (ZOFRAN ) injection 4 mg (4 mg Intravenous Given 12/26/23 2020)  levETIRAcetam  (KEPPRA ) undiluted injection 1,000 mg (1,000 mg Intravenous Given 12/26/23 2020)  acetaminophen  (TYLENOL ) tablet 650 mg (650 mg Oral Given 12/26/23 2131)     IMPRESSION / MDM / ASSESSMENT AND PLAN / ED COURSE  I reviewed the triage vital signs and the nursing notes.                              DDX/MDM/AP: Differential diagnosis includes, but is not limited to, seizure in the setting of medication nonadherence in this patient with a known seizure disorder, doubt underlying electrolyte abnormality.  No findings to suggest intracranial hemorrhage, CVA.  Consider THC lowering seizure threshold.  Fortunately normal neurologic exam at this time and has returned to baseline mental status.  Plan: - Labs - EKG - Antiemetic -Keppra  load - No indication for advanced imaging  Patient's presentation is most consistent with acute presentation with potential threat to life or bodily function.  The patient is on the cardiac monitor to evaluate for evidence of arrhythmia and/or significant heart rate changes.  ED course below.  Initial workup unremarkable, repeat troponin however is uptrending.  Patient remains asymptomatic with no anginal symptoms.  Overall suspect likely was elevated in the setting of strain from recent seizure.  Reassuring cardiac workup last year including cardiac CT.  Signed out to oncoming ED provider pending repeat troponin--if stable/downtrending, stable for discharge home from my perspective and  otherwise medically cleared from a seizure standpoint.  Do not suspect convulsive syncope based on history.  Already has her Keppra  at home.  Plan to continue at this as prescribed and follow-up with her neurologist and primary care doctor.  Clinical Course as of 12/26/23 2336  Mon Dec 26, 2023  2035 CBC reviewed, unremarkable [MM]  2050 Troponin normal CMP reviewed, unremarkable [MM]    Clinical Course User Index [MM] Clarine Ozell LABOR, MD     FINAL CLINICAL IMPRESSION(S) / ED DIAGNOSES   Final diagnoses:  Seizure (HCC)     Rx / DC Orders   ED  Discharge Orders     None        Note:  This document was prepared using Dragon voice recognition software and may include unintentional dictation errors.   Clarine Ozell LABOR, MD 12/26/23 [phone]

## 2023-12-26 NOTE — ED Provider Notes (Signed)
-----------------------------------------   11:07 PM on 12/26/2023 -----------------------------------------  Blood pressure 116/73, pulse 84, temperature (!) 97.5 F (36.4 C), temperature source Oral, resp. rate 19, last menstrual period 03/22/2019, SpO2 98%.  Assuming care from Dr. Clarine.  In short, Colleen Grimes is a 43 y.o. female with a chief complaint of Seizures .  Refer to the original H&P for additional details.  The current plan of care is to follow-up 3rd troponin, plan for discharge if stable.  ----------------------------------------- 1:15 AM on 12/27/2023 ----------------------------------------- Repeat troponin stable compared to previous and on reassessment patient denies any chest pain or shortness of breath.  She states that she feels fatigued but no evidence of recurrent seizure activity.  She is appropriate for discharge home, has seizure medicine available at home and was counseled to follow-up with her neurologist.  She was counseled to return to the ED for new or worsening symptoms, patient agrees with plan.   Willo Dunnings, MD 12/27/23 [phone]

## 2023-12-26 NOTE — ED Triage Notes (Addendum)
 [place]. Been there since 1730; has not been taking seizure meds for a few days.  Take keppra  at home and has it at home; unsure why not taking it. 4 times vomiting. Zofran  given by EMS.  Family witnessed- full body convulsions for 2 mins. No head trauma or mouth trauma nor incontinence. Was very diaphoretic and was complaining of chest pain right before. Now chest pain free. LR 200 cc given by EMS. Was not hypotensive on scene. Initial BP 132/88. Pt was not post ictal for fire who got on scene very quickly.

## 2023-12-27 LAB — TROPONIN I (HIGH SENSITIVITY): Troponin I (High Sensitivity): 50 ng/L — ABNORMAL HIGH (ref <18)

## 2023-12-27 LAB — LIPASE, BLOOD: Lipase: 49 U/L (ref 11–51)
